# Patient Record
Sex: Female | Born: 1970 | Race: Black or African American | Hispanic: No | Marital: Single | Smoking: Former smoker
Health system: Southern US, Community
[De-identification: ages and names within clinical notes are randomized; demographics above are authoritative.]

## PROBLEM LIST (undated history)

## (undated) DIAGNOSIS — R946 Abnormal results of thyroid function studies: Secondary | ICD-10-CM

## (undated) DIAGNOSIS — K828 Other specified diseases of gallbladder: Secondary | ICD-10-CM

## (undated) DIAGNOSIS — I10 Essential (primary) hypertension: Secondary | ICD-10-CM

## (undated) DIAGNOSIS — R000 Tachycardia, unspecified: Secondary | ICD-10-CM

## (undated) DIAGNOSIS — I5021 Acute systolic (congestive) heart failure: Secondary | ICD-10-CM

## (undated) DIAGNOSIS — Z9119 Patient's noncompliance with other medical treatment and regimen: Secondary | ICD-10-CM

## (undated) DIAGNOSIS — Z91199 Patient's noncompliance with other medical treatment and regimen due to unspecified reason: Secondary | ICD-10-CM

## (undated) DIAGNOSIS — A4902 Methicillin resistant Staphylococcus aureus infection, unspecified site: Secondary | ICD-10-CM

## (undated) DIAGNOSIS — I5189 Other ill-defined heart diseases: Secondary | ICD-10-CM

## (undated) DIAGNOSIS — R112 Nausea with vomiting, unspecified: Secondary | ICD-10-CM

## (undated) HISTORY — PX: NO PAST SURGERIES: SHX2092

---

## 2000-10-22 ENCOUNTER — Encounter: Admission: RE | Admit: 2000-10-22 | Discharge: 2001-01-20 | Payer: Self-pay | Admitting: Obstetrics and Gynecology

## 2000-12-25 ENCOUNTER — Observation Stay (HOSPITAL_COMMUNITY): Admission: RE | Admit: 2000-12-25 | Discharge: 2000-12-26 | Payer: Self-pay | Admitting: General Surgery

## 2000-12-27 ENCOUNTER — Encounter (HOSPITAL_COMMUNITY): Admission: RE | Admit: 2000-12-27 | Discharge: 2001-01-26 | Payer: Self-pay | Admitting: General Surgery

## 2001-06-01 ENCOUNTER — Other Ambulatory Visit: Admission: RE | Admit: 2001-06-01 | Discharge: 2001-06-01 | Payer: Self-pay | Admitting: Obstetrics and Gynecology

## 2003-11-05 ENCOUNTER — Emergency Department (HOSPITAL_COMMUNITY): Admission: EM | Admit: 2003-11-05 | Discharge: 2003-11-05 | Payer: Self-pay | Admitting: Emergency Medicine

## 2009-11-09 ENCOUNTER — Inpatient Hospital Stay (HOSPITAL_COMMUNITY): Admission: EM | Admit: 2009-11-09 | Discharge: 2009-11-14 | Payer: Self-pay | Admitting: Emergency Medicine

## 2009-11-12 ENCOUNTER — Ambulatory Visit: Payer: Self-pay | Admitting: Internal Medicine

## 2009-11-13 ENCOUNTER — Encounter: Payer: Self-pay | Admitting: Internal Medicine

## 2010-03-17 DIAGNOSIS — K828 Other specified diseases of gallbladder: Secondary | ICD-10-CM

## 2010-03-17 HISTORY — DX: Other specified diseases of gallbladder: K82.8

## 2010-03-20 ENCOUNTER — Inpatient Hospital Stay (HOSPITAL_COMMUNITY): Admission: EM | Admit: 2010-03-20 | Discharge: 2010-03-24 | Payer: Self-pay | Source: Home / Self Care

## 2010-03-20 LAB — BLOOD GAS, ARTERIAL
Acid-Base Excess: 1.2 mmol/L (ref 0.0–2.0)
Bicarbonate: 25 mEq/L — ABNORMAL HIGH (ref 20.0–24.0)
FIO2: 21 %
O2 Saturation: 92.9 %
Patient temperature: 37
TCO2: 22.1 mmol/L (ref 0–100)
pCO2 arterial: 37.3 mmHg (ref 35.0–45.0)
pH, Arterial: 7.44 — ABNORMAL HIGH (ref 7.350–7.400)
pO2, Arterial: 67.3 mmHg — ABNORMAL LOW (ref 80.0–100.0)

## 2010-03-20 LAB — URINALYSIS, ROUTINE W REFLEX MICROSCOPIC
Ketones, ur: >80 mg/dL — AB
Leukocytes, UA: NEGATIVE
Nitrite: NEGATIVE
Protein, ur: >300 mg/dL — AB
Specific Gravity, Urine: >1.03 — ABNORMAL HIGH (ref 1.005–1.030)
Urine Glucose, Fasting: >1000 mg/dL — AB
Urobilinogen, UA: 0.2 mg/dL (ref 0.0–1.0)
pH: 5.5 (ref 5.0–8.0)

## 2010-03-20 LAB — DIFFERENTIAL
Basophils Absolute: 0 10*3/uL (ref 0.0–0.1)
Basophils Relative: 0 % (ref 0–1)
Eosinophils Absolute: 0 10*3/uL (ref 0.0–0.7)
Eosinophils Relative: 0 % (ref 0–5)
Lymphocytes Relative: 10 % — ABNORMAL LOW (ref 12–46)
Lymphs Abs: 0.8 10*3/uL (ref 0.7–4.0)
Monocytes Absolute: 0.6 10*3/uL (ref 0.1–1.0)
Monocytes Relative: 7 % (ref 3–12)
Neutro Abs: 6.7 10*3/uL (ref 1.7–7.7)
Neutrophils Relative %: 82 % — ABNORMAL HIGH (ref 43–77)

## 2010-03-20 LAB — HEPATIC FUNCTION PANEL
ALT: 23 U/L (ref 0–35)
AST: 17 U/L (ref 0–37)
Albumin: 4.4 g/dL (ref 3.5–5.2)
Alkaline Phosphatase: 76 U/L (ref 39–117)
Bilirubin, Direct: 0.2 mg/dL (ref 0.0–0.3)
Indirect Bilirubin: 0.9 mg/dL (ref 0.3–0.9)
Total Bilirubin: 1.1 mg/dL (ref 0.3–1.2)
Total Protein: 8 g/dL (ref 6.0–8.3)

## 2010-03-20 LAB — BASIC METABOLIC PANEL
BUN: 16 mg/dL (ref 6–23)
BUN: 15 mg/dL (ref 6–23)
CO2: 24 mEq/L (ref 19–32)
CO2: 24 mEq/L (ref 19–32)
Calcium: 10.1 mg/dL (ref 8.4–10.5)
Calcium: 8.6 mg/dL (ref 8.4–10.5)
Chloride: 97 mEq/L (ref 96–112)
Chloride: 104 mEq/L (ref 96–112)
Creatinine, Ser: 0.92 mg/dL (ref 0.4–1.2)
Creatinine, Ser: 0.72 mg/dL (ref 0.4–1.2)
GFR calc Af Amer: >60 mL/min (ref >60)
GFR calc Af Amer: >60 mL/min (ref >60)
GFR calc non Af Amer: >60 mL/min (ref >60)
GFR calc non Af Amer: >60 mL/min (ref >60)
Glucose, Bld: 283 mg/dL — ABNORMAL HIGH (ref 70–99)
Glucose, Bld: 216 mg/dL — ABNORMAL HIGH (ref 70–99)
Potassium: 3.2 mEq/L — ABNORMAL LOW (ref 3.5–5.1)
Potassium: 2.9 mEq/L — ABNORMAL LOW (ref 3.5–5.1)
Sodium: 140 mEq/L (ref 135–145)
Sodium: 140 mEq/L (ref 135–145)

## 2010-03-20 LAB — CBC
HCT: 41.7 % (ref 36.0–46.0)
Hemoglobin: 15 g/dL (ref 12.0–15.0)
MCH: 30.4 pg (ref 26.0–34.0)
MCHC: 36 g/dL (ref 30.0–36.0)
MCV: 84.4 fL (ref 78.0–100.0)
Platelets: 398 10*3/uL (ref 150–400)
RBC: 4.94 MIL/uL (ref 3.87–5.11)
RDW: 12.5 % (ref 11.5–15.5)
WBC: 8.2 10*3/uL (ref 4.0–10.5)

## 2010-03-20 LAB — GLUCOSE, CAPILLARY
Glucose-Capillary: 284 mg/dL — ABNORMAL HIGH (ref 70–99)
Glucose-Capillary: 181 mg/dL — ABNORMAL HIGH (ref 70–99)

## 2010-03-20 LAB — URINE MICROSCOPIC-ADD ON

## 2010-03-20 LAB — LIPASE, BLOOD: Lipase: 24 U/L (ref 11–59)

## 2010-03-20 LAB — MAGNESIUM: Magnesium: 1.8 mg/dL (ref 1.5–2.5)

## 2010-03-20 LAB — POCT PREGNANCY, URINE: Preg Test, Ur: NEGATIVE

## 2010-03-21 LAB — MAGNESIUM
Magnesium: 1.7 mg/dL (ref 1.5–2.5)
Magnesium: 2 mg/dL (ref 1.5–2.5)

## 2010-03-21 LAB — URINE CULTURE
Colony Count: 6000
Culture  Setup Time: 201201050113

## 2010-03-21 LAB — COMPREHENSIVE METABOLIC PANEL
ALT: 18 U/L (ref 0–35)
AST: 13 U/L (ref 0–37)
Albumin: 3.2 g/dL — ABNORMAL LOW (ref 3.5–5.2)
Alkaline Phosphatase: 46 U/L (ref 39–117)
BUN: 12 mg/dL (ref 6–23)
CO2: 25 mEq/L (ref 19–32)
Calcium: 8.2 mg/dL — ABNORMAL LOW (ref 8.4–10.5)
Chloride: 112 mEq/L (ref 96–112)
Creatinine, Ser: 0.67 mg/dL (ref 0.4–1.2)
GFR calc Af Amer: >60 mL/min (ref >60)
GFR calc non Af Amer: >60 mL/min (ref >60)
Glucose, Bld: 90 mg/dL (ref 70–99)
Potassium: 3.2 mEq/L — ABNORMAL LOW (ref 3.5–5.1)
Sodium: 145 mEq/L (ref 135–145)
Total Bilirubin: 1.1 mg/dL (ref 0.3–1.2)
Total Protein: 6.3 g/dL (ref 6.0–8.3)

## 2010-03-21 LAB — CBC
HCT: 33.4 % — ABNORMAL LOW (ref 36.0–46.0)
Hemoglobin: 11.5 g/dL — ABNORMAL LOW (ref 12.0–15.0)
MCH: 29.6 pg (ref 26.0–34.0)
MCHC: 34.4 g/dL (ref 30.0–36.0)
MCV: 86.1 fL (ref 78.0–100.0)
Platelets: 321 10*3/uL (ref 150–400)
RBC: 3.88 MIL/uL (ref 3.87–5.11)
RDW: 12.8 % (ref 11.5–15.5)
WBC: 7.4 10*3/uL (ref 4.0–10.5)

## 2010-03-21 LAB — GLUCOSE, CAPILLARY
Glucose-Capillary: 119 mg/dL — ABNORMAL HIGH (ref 70–99)
Glucose-Capillary: 172 mg/dL — ABNORMAL HIGH (ref 70–99)
Glucose-Capillary: 111 mg/dL — ABNORMAL HIGH (ref 70–99)
Glucose-Capillary: 179 mg/dL — ABNORMAL HIGH (ref 70–99)

## 2010-03-21 LAB — DIFFERENTIAL
Basophils Absolute: 0 10*3/uL (ref 0.0–0.1)
Basophils Relative: 0 % (ref 0–1)
Eosinophils Absolute: 0 10*3/uL (ref 0.0–0.7)
Eosinophils Relative: 1 % (ref 0–5)
Lymphocytes Relative: 30 % (ref 12–46)
Lymphs Abs: 2.2 10*3/uL (ref 0.7–4.0)
Monocytes Absolute: 1 10*3/uL (ref 0.1–1.0)
Monocytes Relative: 13 % — ABNORMAL HIGH (ref 3–12)
Neutro Abs: 4.1 10*3/uL (ref 1.7–7.7)
Neutrophils Relative %: 56 % (ref 43–77)

## 2010-03-21 LAB — HEMOGLOBIN A1C
Hgb A1c MFr Bld: 8.1 % — ABNORMAL HIGH (ref <5.7)
Mean Plasma Glucose: 186 mg/dL — ABNORMAL HIGH (ref <117)

## 2010-03-22 LAB — CBC
HCT: 33.6 % — ABNORMAL LOW (ref 36.0–46.0)
Hemoglobin: 11.7 g/dL — ABNORMAL LOW (ref 12.0–15.0)
MCH: 29.9 pg (ref 26.0–34.0)
MCHC: 34.8 g/dL (ref 30.0–36.0)
MCV: 85.9 fL (ref 78.0–100.0)
Platelets: 315 10*3/uL (ref 150–400)
RBC: 3.91 MIL/uL (ref 3.87–5.11)
RDW: 12.2 % (ref 11.5–15.5)
WBC: 5.8 10*3/uL (ref 4.0–10.5)

## 2010-03-22 LAB — GLUCOSE, CAPILLARY
Glucose-Capillary: 161 mg/dL — ABNORMAL HIGH (ref 70–99)
Glucose-Capillary: 238 mg/dL — ABNORMAL HIGH (ref 70–99)

## 2010-03-22 LAB — DIFFERENTIAL
Basophils Absolute: 0 10*3/uL (ref 0.0–0.1)
Basophils Relative: 0 % (ref 0–1)
Eosinophils Absolute: 0.1 10*3/uL (ref 0.0–0.7)
Eosinophils Relative: 2 % (ref 0–5)
Lymphocytes Relative: 47 % — ABNORMAL HIGH (ref 12–46)
Lymphs Abs: 2.7 10*3/uL (ref 0.7–4.0)
Monocytes Absolute: 0.8 10*3/uL (ref 0.1–1.0)
Monocytes Relative: 13 % — ABNORMAL HIGH (ref 3–12)
Neutro Abs: 2.2 10*3/uL (ref 1.7–7.7)
Neutrophils Relative %: 38 % — ABNORMAL LOW (ref 43–77)

## 2010-03-22 LAB — COMPREHENSIVE METABOLIC PANEL
ALT: 18 U/L (ref 0–35)
AST: 14 U/L (ref 0–37)
Albumin: 3 g/dL — ABNORMAL LOW (ref 3.5–5.2)
Alkaline Phosphatase: 45 U/L (ref 39–117)
BUN: 5 mg/dL — ABNORMAL LOW (ref 6–23)
CO2: 26 mEq/L (ref 19–32)
Calcium: 8.3 mg/dL — ABNORMAL LOW (ref 8.4–10.5)
Chloride: 110 mEq/L (ref 96–112)
Creatinine, Ser: 0.51 mg/dL (ref 0.4–1.2)
GFR calc Af Amer: >60 mL/min (ref >60)
GFR calc non Af Amer: >60 mL/min (ref >60)
Glucose, Bld: 101 mg/dL — ABNORMAL HIGH (ref 70–99)
Potassium: 3.4 mEq/L — ABNORMAL LOW (ref 3.5–5.1)
Sodium: 142 mEq/L (ref 135–145)
Total Bilirubin: 1 mg/dL (ref 0.3–1.2)
Total Protein: 5.9 g/dL — ABNORMAL LOW (ref 6.0–8.3)

## 2010-03-22 LAB — LIPASE, BLOOD: Lipase: 41 U/L (ref 11–59)

## 2010-03-25 ENCOUNTER — Encounter (HOSPITAL_COMMUNITY): Admission: RE | Admit: 2010-03-25 | Payer: Self-pay | Source: Home / Self Care | Admitting: Internal Medicine

## 2010-04-01 LAB — BASIC METABOLIC PANEL
BUN: 5 mg/dL — ABNORMAL LOW (ref 6–23)
CO2: 27 mEq/L (ref 19–32)
Calcium: 8.7 mg/dL (ref 8.4–10.5)
Chloride: 103 mEq/L (ref 96–112)
Creatinine, Ser: 0.65 mg/dL (ref 0.4–1.2)
GFR calc Af Amer: >60 mL/min (ref >60)
GFR calc non Af Amer: >60 mL/min (ref >60)
Glucose, Bld: 98 mg/dL (ref 70–99)
Potassium: 3.7 mEq/L (ref 3.5–5.1)
Sodium: 137 mEq/L (ref 135–145)

## 2010-04-01 LAB — HEMOGLOBIN A1C
Hgb A1c MFr Bld: 8.3 % — ABNORMAL HIGH (ref <5.7)
Mean Plasma Glucose: 192 mg/dL — ABNORMAL HIGH (ref <117)

## 2010-04-01 LAB — CULTURE, BLOOD (ROUTINE X 2)
Culture: NO GROWTH
Culture: NO GROWTH

## 2010-04-01 LAB — GLUCOSE, CAPILLARY
Glucose-Capillary: 168 mg/dL — ABNORMAL HIGH (ref 70–99)
Glucose-Capillary: 116 mg/dL — ABNORMAL HIGH (ref 70–99)
Glucose-Capillary: 135 mg/dL — ABNORMAL HIGH (ref 70–99)
Glucose-Capillary: 177 mg/dL — ABNORMAL HIGH (ref 70–99)
Glucose-Capillary: 185 mg/dL — ABNORMAL HIGH (ref 70–99)
Glucose-Capillary: 162 mg/dL — ABNORMAL HIGH (ref 70–99)
Glucose-Capillary: 115 mg/dL — ABNORMAL HIGH (ref 70–99)
Glucose-Capillary: 244 mg/dL — ABNORMAL HIGH (ref 70–99)

## 2010-04-01 LAB — DIFFERENTIAL
Basophils Absolute: 0 10*3/uL (ref 0.0–0.1)
Basophils Relative: 0 % (ref 0–1)
Eosinophils Absolute: 0.1 10*3/uL (ref 0.0–0.7)
Eosinophils Relative: 1 % (ref 0–5)
Lymphocytes Relative: 43 % (ref 12–46)
Lymphs Abs: 2.3 10*3/uL (ref 0.7–4.0)
Monocytes Absolute: 0.7 10*3/uL (ref 0.1–1.0)
Monocytes Relative: 13 % — ABNORMAL HIGH (ref 3–12)
Neutro Abs: 2.2 10*3/uL (ref 1.7–7.7)
Neutrophils Relative %: 42 % — ABNORMAL LOW (ref 43–77)

## 2010-04-01 LAB — CBC
HCT: 36.7 % (ref 36.0–46.0)
Hemoglobin: 12.9 g/dL (ref 12.0–15.0)
MCH: 29.5 pg (ref 26.0–34.0)
MCHC: 35.1 g/dL (ref 30.0–36.0)
MCV: 84 fL (ref 78.0–100.0)
Platelets: 331 10*3/uL (ref 150–400)
RBC: 4.37 MIL/uL (ref 3.87–5.11)
RDW: 11.9 % (ref 11.5–15.5)
WBC: 5.3 10*3/uL (ref 4.0–10.5)

## 2010-04-01 LAB — MAGNESIUM: Magnesium: 1.8 mg/dL (ref 1.5–2.5)

## 2010-04-09 ENCOUNTER — Encounter (HOSPITAL_COMMUNITY)
Admission: RE | Admit: 2010-04-09 | Discharge: 2010-04-16 | Payer: Self-pay | Source: Home / Self Care | Attending: Family Medicine | Admitting: Family Medicine

## 2010-04-16 NOTE — Letter (Signed)
Summary: CONSULTATION  CONSULTATION   Imported By: Rexene Alberts 11/13/2009 11:46:23  _____________________________________________________________________  External Attachment:    Type:   Image     Comment:   External Document

## 2010-05-04 ENCOUNTER — Emergency Department (HOSPITAL_COMMUNITY)
Admission: EM | Admit: 2010-05-04 | Discharge: 2010-05-04 | Disposition: A | Discharge status: Home / Self Care | Payer: BC Managed Care – PPO | Attending: Emergency Medicine | Admitting: Emergency Medicine

## 2010-05-04 DIAGNOSIS — I10 Essential (primary) hypertension: Secondary | ICD-10-CM | POA: Insufficient documentation

## 2010-05-04 DIAGNOSIS — R112 Nausea with vomiting, unspecified: Secondary | ICD-10-CM | POA: Insufficient documentation

## 2010-05-04 DIAGNOSIS — E119 Type 2 diabetes mellitus without complications: Secondary | ICD-10-CM | POA: Insufficient documentation

## 2010-05-04 DIAGNOSIS — R197 Diarrhea, unspecified: Secondary | ICD-10-CM | POA: Insufficient documentation

## 2010-05-04 DIAGNOSIS — K219 Gastro-esophageal reflux disease without esophagitis: Secondary | ICD-10-CM | POA: Insufficient documentation

## 2010-05-04 DIAGNOSIS — Z79899 Other long term (current) drug therapy: Secondary | ICD-10-CM | POA: Insufficient documentation

## 2010-05-30 LAB — GLUCOSE, CAPILLARY
Glucose-Capillary: 115 mg/dL — ABNORMAL HIGH (ref 70–99)
Glucose-Capillary: 117 mg/dL — ABNORMAL HIGH (ref 70–99)
Glucose-Capillary: 118 mg/dL — ABNORMAL HIGH (ref 70–99)
Glucose-Capillary: 134 mg/dL — ABNORMAL HIGH (ref 70–99)
Glucose-Capillary: 135 mg/dL — ABNORMAL HIGH (ref 70–99)
Glucose-Capillary: 138 mg/dL — ABNORMAL HIGH (ref 70–99)
Glucose-Capillary: 142 mg/dL — ABNORMAL HIGH (ref 70–99)
Glucose-Capillary: 145 mg/dL — ABNORMAL HIGH (ref 70–99)
Glucose-Capillary: 151 mg/dL — ABNORMAL HIGH (ref 70–99)
Glucose-Capillary: 160 mg/dL — ABNORMAL HIGH (ref 70–99)
Glucose-Capillary: 163 mg/dL — ABNORMAL HIGH (ref 70–99)
Glucose-Capillary: 180 mg/dL — ABNORMAL HIGH (ref 70–99)
Glucose-Capillary: 184 mg/dL — ABNORMAL HIGH (ref 70–99)
Glucose-Capillary: 187 mg/dL — ABNORMAL HIGH (ref 70–99)
Glucose-Capillary: 187 mg/dL — ABNORMAL HIGH (ref 70–99)
Glucose-Capillary: 192 mg/dL — ABNORMAL HIGH (ref 70–99)
Glucose-Capillary: 192 mg/dL — ABNORMAL HIGH (ref 70–99)
Glucose-Capillary: 192 mg/dL — ABNORMAL HIGH (ref 70–99)
Glucose-Capillary: 201 mg/dL — ABNORMAL HIGH (ref 70–99)
Glucose-Capillary: 205 mg/dL — ABNORMAL HIGH (ref 70–99)
Glucose-Capillary: 223 mg/dL — ABNORMAL HIGH (ref 70–99)
Glucose-Capillary: 235 mg/dL — ABNORMAL HIGH (ref 70–99)
Glucose-Capillary: 251 mg/dL — ABNORMAL HIGH (ref 70–99)
Glucose-Capillary: 62 mg/dL — ABNORMAL LOW (ref 70–99)
Glucose-Capillary: 85 mg/dL (ref 70–99)

## 2010-05-30 LAB — URINE MICROSCOPIC-ADD ON

## 2010-05-30 LAB — T4, FREE: Free T4: 1.34 ng/dL (ref 0.80–1.80)

## 2010-05-30 LAB — BASIC METABOLIC PANEL
BUN: 10 mg/dL (ref 6–23)
BUN: 14 mg/dL (ref 6–23)
BUN: 4 mg/dL — ABNORMAL LOW (ref 6–23)
BUN: 5 mg/dL — ABNORMAL LOW (ref 6–23)
BUN: 6 mg/dL (ref 6–23)
BUN: 7 mg/dL (ref 6–23)
CO2: 13 mEq/L — ABNORMAL LOW (ref 19–32)
CO2: 14 mEq/L — ABNORMAL LOW (ref 19–32)
CO2: 21 mEq/L (ref 19–32)
CO2: 23 mEq/L (ref 19–32)
Calcium: 8.5 mg/dL (ref 8.4–10.5)
Calcium: 8.8 mg/dL (ref 8.4–10.5)
Calcium: 8.8 mg/dL (ref 8.4–10.5)
Chloride: 102 mEq/L (ref 96–112)
Chloride: 105 mEq/L (ref 96–112)
Chloride: 105 mEq/L (ref 96–112)
Chloride: 107 mEq/L (ref 96–112)
Chloride: 109 mEq/L (ref 96–112)
Chloride: 113 mEq/L — ABNORMAL HIGH (ref 96–112)
Chloride: 114 mEq/L — ABNORMAL HIGH (ref 96–112)
Creatinine, Ser: 0.54 mg/dL (ref 0.4–1.2)
Creatinine, Ser: 0.54 mg/dL (ref 0.4–1.2)
Creatinine, Ser: 0.58 mg/dL (ref 0.4–1.2)
Creatinine, Ser: 0.61 mg/dL (ref 0.4–1.2)
GFR calc Af Amer: >60 mL/min (ref >60)
GFR calc Af Amer: >60 mL/min (ref >60)
GFR calc Af Amer: >60 mL/min (ref >60)
GFR calc non Af Amer: >60 mL/min (ref >60)
GFR calc non Af Amer: >60 mL/min (ref >60)
GFR calc non Af Amer: >60 mL/min (ref >60)
GFR calc non Af Amer: >60 mL/min (ref >60)
GFR calc non Af Amer: >60 mL/min (ref >60)
Glucose, Bld: 102 mg/dL — ABNORMAL HIGH (ref 70–99)
Glucose, Bld: 158 mg/dL — ABNORMAL HIGH (ref 70–99)
Glucose, Bld: 160 mg/dL — ABNORMAL HIGH (ref 70–99)
Glucose, Bld: 189 mg/dL — ABNORMAL HIGH (ref 70–99)
Glucose, Bld: 261 mg/dL — ABNORMAL HIGH (ref 70–99)
Potassium: 3.2 mEq/L — ABNORMAL LOW (ref 3.5–5.1)
Potassium: 3.2 mEq/L — ABNORMAL LOW (ref 3.5–5.1)
Potassium: 3.3 mEq/L — ABNORMAL LOW (ref 3.5–5.1)
Potassium: 3.4 mEq/L — ABNORMAL LOW (ref 3.5–5.1)
Potassium: 3.5 mEq/L (ref 3.5–5.1)
Potassium: 3.6 mEq/L (ref 3.5–5.1)
Potassium: 3.8 mEq/L (ref 3.5–5.1)
Potassium: 3.9 mEq/L (ref 3.5–5.1)
Sodium: 134 mEq/L — ABNORMAL LOW (ref 135–145)
Sodium: 138 mEq/L (ref 135–145)
Sodium: 139 mEq/L (ref 135–145)

## 2010-05-30 LAB — LIPASE, BLOOD
Lipase: 42 U/L (ref 11–59)
Lipase: 77 U/L — ABNORMAL HIGH (ref 11–59)

## 2010-05-30 LAB — DIFFERENTIAL
Basophils Absolute: 0 10*3/uL (ref 0.0–0.1)
Basophils Relative: 0 % (ref 0–1)
Basophils Relative: 1 % (ref 0–1)
Eosinophils Absolute: 0 10*3/uL (ref 0.0–0.7)
Eosinophils Absolute: 0 10*3/uL (ref 0.0–0.7)
Eosinophils Relative: 0 % (ref 0–5)
Eosinophils Relative: 0 % (ref 0–5)
Lymphocytes Relative: 16 % (ref 12–46)
Lymphocytes Relative: 8 % — ABNORMAL LOW (ref 12–46)
Lymphs Abs: 0.6 10*3/uL — ABNORMAL LOW (ref 0.7–4.0)
Lymphs Abs: 1.1 10*3/uL (ref 0.7–4.0)
Lymphs Abs: 2.3 10*3/uL (ref 0.7–4.0)
Monocytes Absolute: 0.8 10*3/uL (ref 0.1–1.0)
Monocytes Relative: 14 % — ABNORMAL HIGH (ref 3–12)
Neutro Abs: 11.1 10*3/uL — ABNORMAL HIGH (ref 1.7–7.7)
Neutro Abs: 13.3 10*3/uL — ABNORMAL HIGH (ref 1.7–7.7)
Neutro Abs: 3.4 10*3/uL (ref 1.7–7.7)
Neutrophils Relative %: 51 % (ref 43–77)
Neutrophils Relative %: 76 % (ref 43–77)
Neutrophils Relative %: 93 % — ABNORMAL HIGH (ref 43–77)

## 2010-05-30 LAB — CBC
HCT: 40.8 % (ref 36.0–46.0)
HCT: 46.6 % — ABNORMAL HIGH (ref 36.0–46.0)
Hemoglobin: 13.5 g/dL (ref 12.0–15.0)
Hemoglobin: 15.6 g/dL — ABNORMAL HIGH (ref 12.0–15.0)
MCH: 30.3 pg (ref 26.0–34.0)
MCH: 30.5 pg (ref 26.0–34.0)
MCHC: 33.4 g/dL (ref 30.0–36.0)
MCHC: 33.6 g/dL (ref 30.0–36.0)
Platelets: 297 10*3/uL (ref 150–400)
Platelets: 327 10*3/uL (ref 150–400)
RBC: 5 MIL/uL (ref 3.87–5.11)
RBC: 5.14 MIL/uL — ABNORMAL HIGH (ref 3.87–5.11)
WBC: 13.1 10*3/uL — ABNORMAL HIGH (ref 4.0–10.5)
WBC: 14.2 10*3/uL — ABNORMAL HIGH (ref 4.0–10.5)
WBC: 6.7 10*3/uL (ref 4.0–10.5)

## 2010-05-30 LAB — COMPREHENSIVE METABOLIC PANEL
ALT: 17 U/L (ref 0–35)
ALT: 21 U/L (ref 0–35)
AST: 13 U/L (ref 0–37)
Alkaline Phosphatase: 97 U/L (ref 39–117)
BUN: 16 mg/dL (ref 6–23)
CO2: 11 mEq/L — ABNORMAL LOW (ref 19–32)
CO2: 19 mEq/L (ref 19–32)
Calcium: 9.1 mg/dL (ref 8.4–10.5)
Calcium: 9.7 mg/dL (ref 8.4–10.5)
Chloride: 102 mEq/L (ref 96–112)
Chloride: 106 mEq/L (ref 96–112)
Creatinine, Ser: 0.65 mg/dL (ref 0.4–1.2)
GFR calc Af Amer: >60 mL/min (ref >60)
GFR calc Af Amer: >60 mL/min (ref >60)
GFR calc non Af Amer: 53 mL/min — ABNORMAL LOW (ref >60)
GFR calc non Af Amer: >60 mL/min (ref >60)
Glucose, Bld: 244 mg/dL — ABNORMAL HIGH (ref 70–99)
Glucose, Bld: 413 mg/dL — ABNORMAL HIGH (ref 70–99)
Potassium: 4.3 mEq/L (ref 3.5–5.1)
Total Bilirubin: 1 mg/dL (ref 0.3–1.2)
Total Protein: 8.5 g/dL — ABNORMAL HIGH (ref 6.0–8.3)

## 2010-05-30 LAB — RAPID URINE DRUG SCREEN, HOSP PERFORMED
Amphetamines: NOT DETECTED
Benzodiazepines: NOT DETECTED
Cocaine: NOT DETECTED
Tetrahydrocannabinol: NOT DETECTED

## 2010-05-30 LAB — LIPID PANEL: VLDL: 36 mg/dL (ref 0–40)

## 2010-05-30 LAB — HEPATIC FUNCTION PANEL
ALT: 12 U/L (ref 0–35)
AST: 12 U/L (ref 0–37)
Albumin: 2.8 g/dL — ABNORMAL LOW (ref 3.5–5.2)
Albumin: 2.8 g/dL — ABNORMAL LOW (ref 3.5–5.2)
Alkaline Phosphatase: 53 U/L (ref 39–117)
Alkaline Phosphatase: 63 U/L (ref 39–117)
Bilirubin, Direct: 0.2 mg/dL (ref 0.0–0.3)
Indirect Bilirubin: 1.3 mg/dL — ABNORMAL HIGH (ref 0.3–0.9)
Indirect Bilirubin: 1.4 mg/dL — ABNORMAL HIGH (ref 0.3–0.9)
Total Bilirubin: 1.6 mg/dL — ABNORMAL HIGH (ref 0.3–1.2)
Total Protein: 5.9 g/dL — ABNORMAL LOW (ref 6.0–8.3)
Total Protein: 6.2 g/dL (ref 6.0–8.3)

## 2010-05-30 LAB — URINALYSIS, ROUTINE W REFLEX MICROSCOPIC
Glucose, UA: >1000 mg/dL — AB
Leukocytes, UA: NEGATIVE
pH: 5.5 (ref 5.0–8.0)

## 2010-05-30 LAB — WET PREP, GENITAL

## 2010-05-30 LAB — POCT PREGNANCY, URINE: Preg Test, Ur: NEGATIVE

## 2010-05-30 LAB — HEMOGLOBIN A1C: Mean Plasma Glucose: 309 mg/dL — ABNORMAL HIGH (ref <117)

## 2010-05-30 LAB — URINE CULTURE

## 2010-05-30 LAB — MAGNESIUM
Magnesium: 1.7 mg/dL (ref 1.5–2.5)
Magnesium: 1.7 mg/dL (ref 1.5–2.5)

## 2010-05-30 LAB — TSH: TSH: 0.486 u[IU]/mL (ref 0.350–4.500)

## 2010-05-30 LAB — GC/CHLAMYDIA PROBE AMP, GENITAL: GC Probe Amp, Genital: NEGATIVE

## 2010-05-30 LAB — BRAIN NATRIURETIC PEPTIDE: Pro B Natriuretic peptide (BNP): 199 pg/mL — ABNORMAL HIGH (ref 0.0–100.0)

## 2010-08-02 NOTE — Consult Note (Signed)
[facility]  Patient:    DAY, GREB Visit Number: [id number] MRN: [id number]          Service Type: REC Location: [facility] Attending Physician:  Barbaraann Barthel Dictated by:   Barbaraann Barthel, M.D. Proc. Date: 12/24/00 Admit Date:  12/27/2000   CC:         Mel Almond, M.D.   Consultation Report  Dear Brett Canales:  I saw Ms. Lauren Steele in my office on December 24, 2000.  As you know, she was seen by your physicians assistant today and she had an appointment with me tomorrow.  I told them to send her directly over to my office when they described that she might have a breast abscess.  In essence, she has an acutely inflamed right breast with, I suspect, an abscess.  She has had problems with this for three days and this is somewhat complicated in the fact that she applied a heating pad and sustained a superficial burn as well to her breast.  At any rate, she had an area of some spontaneous drainage and some necrotic skin around the lateral aspect of the nipple areola complex.  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  We will plan for a biopsy and incision and drainage and packing of her breast tomorrow.  We have placed her on antibiotics and we will check her blood sugar preoperatively as well, for as you know, she is a type 2 diabetic.  We have her scheduled for surgery tomorrow and I will make sure you get a copy of the surgical note and pathology and I thank you for sending her my way. Dictated by:   Barbaraann Barthel, M.D. Attending Physician:  Barbaraann Barthel DD:  12/24/00 TD:  12/25/00 Job: [id number]

## 2010-08-02 NOTE — Op Note (Signed)
[facility]  Patient:    Lauren Steele, Lauren Steele Visit Number: [id number] MRN: [id number]          Service Type: REC Location: [facility] Attending Physician:  Barbaraann Barthel Dictated by:   Barbaraann Barthel, M.D. Proc. Date: 12/25/00 Admit Date:  12/27/2000                             Operative Report  PREOPERATIVE DIAGNOSIS:  Right breast abscess.  POSTOPERATIVE DIAGNOSIS:  Right breast abscess.  OPERATION:  Right partial mastectomy (incision and drainage of large abscess with extensive debridement).  SURGEON:  Barbaraann Barthel, M.D.  CLINICAL NOTE:  This is a 40 year old black, type 2 diabetic female patient who was seen by the medical service for a painful right breast.  She was referred to surgery when she was noted to have a breast abscess.  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  She went to see Dr. Lilyan Punt, and Dr. Cathlyn Parsons physicians assistant sent her my way.  She was admitted directly the next day for incision and drainage after beginning Cipro and warm compresses.  It should be stated the skin changes were also complicated as the patient, during her three days of her problem, had self treated herself with a heating pad and received a partial-thickness burn over the area of her right breast.  GROSS OPERATIVE FINDINGS:  The patient had a deformed nipple-areolar complex with an obvious swelling and peeling and peau dorange of her right breast. This was located approximately at the 3 oclock position and extended medially on her breast.  We obtained a great amount of yellowish, thick pus which was cultured for aerobic and anaerobic growth, and extensive debridement of necrotic breast tissue.  TECHNIQUE:  The patient was placed in the supine position.  After  adequate administration of general anesthesia, her right hemithorax was prepped with Betadine solution and draped in the usual manner.  A periareolar incision was carried out over the medial aspect of the right nipple complex, and pus was evacuated, and cultures were obtained.  The necrotic skin was debrided along with necrotic breast tissue.  These were sent as a specimen.  We also had called the pathologist beforehand to examine the tissue to rule out inflammatory carcinoma of the breast.  The breast tissue was sent also in formalin for this biopsy.  The wound was irrigated, and the bleeding was controlled with the cautery device.  After obtaining good hemostasis and debriding all the necrotic tissue, the wound was packed open with 3 inch Kerlix cling that had been soaked in normal saline solution; 4 x 4s and ABD pads were placed as a dressing.  Prior to closure, all sponge, needle, and instrument counts were found to be correct.  Estimated blood loss was maybe 50 cc.  The patient tolerated well.  She received 1600 cc of crystalloid intraoperatively. Dictated by:   Barbaraann Barthel, M.D. Attending Physician:  Barbaraann Barthel DD:  12/25/00 TD:  12/27/00 Job: [id number]

## 2011-05-29 ENCOUNTER — Encounter (HOSPITAL_COMMUNITY): Payer: Self-pay | Admitting: Emergency Medicine

## 2011-05-29 ENCOUNTER — Inpatient Hospital Stay (HOSPITAL_COMMUNITY)
Admission: EM | Admit: 2011-05-29 | Discharge: 2011-06-04 | DRG: 294 | Disposition: A | Discharge status: Home / Self Care | Payer: BC Managed Care – PPO | Attending: Internal Medicine | Admitting: Internal Medicine

## 2011-05-29 ENCOUNTER — Other Ambulatory Visit: Payer: Self-pay

## 2011-05-29 DIAGNOSIS — Z9119 Patient's noncompliance with other medical treatment and regimen: Secondary | ICD-10-CM

## 2011-05-29 DIAGNOSIS — L03317 Cellulitis of buttock: Secondary | ICD-10-CM | POA: Diagnosis present

## 2011-05-29 DIAGNOSIS — E1110 Type 2 diabetes mellitus with ketoacidosis without coma: Secondary | ICD-10-CM

## 2011-05-29 DIAGNOSIS — E876 Hypokalemia: Secondary | ICD-10-CM | POA: Diagnosis present

## 2011-05-29 DIAGNOSIS — Z91199 Patient's noncompliance with other medical treatment and regimen due to unspecified reason: Secondary | ICD-10-CM

## 2011-05-29 DIAGNOSIS — I498 Other specified cardiac arrhythmias: Secondary | ICD-10-CM | POA: Diagnosis present

## 2011-05-29 DIAGNOSIS — K828 Other specified diseases of gallbladder: Secondary | ICD-10-CM | POA: Diagnosis present

## 2011-05-29 DIAGNOSIS — K3184 Gastroparesis: Secondary | ICD-10-CM | POA: Diagnosis present

## 2011-05-29 DIAGNOSIS — R000 Tachycardia, unspecified: Secondary | ICD-10-CM | POA: Diagnosis present

## 2011-05-29 DIAGNOSIS — E1149 Type 2 diabetes mellitus with other diabetic neurological complication: Secondary | ICD-10-CM | POA: Diagnosis present

## 2011-05-29 DIAGNOSIS — A4902 Methicillin resistant Staphylococcus aureus infection, unspecified site: Secondary | ICD-10-CM | POA: Diagnosis present

## 2011-05-29 DIAGNOSIS — E1310 Other specified diabetes mellitus with ketoacidosis without coma: Principal | ICD-10-CM | POA: Diagnosis present

## 2011-05-29 DIAGNOSIS — L0231 Cutaneous abscess of buttock: Secondary | ICD-10-CM | POA: Diagnosis present

## 2011-05-29 DIAGNOSIS — Z794 Long term (current) use of insulin: Secondary | ICD-10-CM

## 2011-05-29 DIAGNOSIS — I10 Essential (primary) hypertension: Secondary | ICD-10-CM | POA: Diagnosis present

## 2011-05-29 DIAGNOSIS — K920 Hematemesis: Secondary | ICD-10-CM | POA: Diagnosis present

## 2011-05-29 HISTORY — DX: Tachycardia, unspecified: R00.0

## 2011-05-29 HISTORY — DX: Methicillin resistant Staphylococcus aureus infection, unspecified site: A49.02

## 2011-05-29 HISTORY — DX: Essential (primary) hypertension: I10

## 2011-05-29 HISTORY — DX: Patient's noncompliance with other medical treatment and regimen due to unspecified reason: Z91.199

## 2011-05-29 HISTORY — DX: Patient's noncompliance with other medical treatment and regimen: Z91.19

## 2011-05-29 LAB — CBC
Hemoglobin: 12.7 g/dL (ref 12.0–15.0)
MCH: 30.2 pg (ref 26.0–34.0)
MCHC: 35.5 g/dL (ref 30.0–36.0)
Platelets: 541 10*3/uL — ABNORMAL HIGH (ref 150–400)
RDW: 13 % (ref 11.5–15.5)

## 2011-05-29 LAB — COMPREHENSIVE METABOLIC PANEL
AST: 8 U/L (ref 0–37)
Albumin: 2.7 g/dL — ABNORMAL LOW (ref 3.5–5.2)
Alkaline Phosphatase: 117 U/L (ref 39–117)
BUN: 7 mg/dL (ref 6–23)
Chloride: 95 mEq/L — ABNORMAL LOW (ref 96–112)
Potassium: 2.6 mEq/L — CL (ref 3.5–5.1)
Total Protein: 8.7 g/dL — ABNORMAL HIGH (ref 6.0–8.3)

## 2011-05-29 LAB — DIFFERENTIAL
Basophils Absolute: 0 10*3/uL (ref 0.0–0.1)
Basophils Relative: 0 % (ref 0–1)
Eosinophils Absolute: 0 10*3/uL (ref 0.0–0.7)
Monocytes Relative: 6 % (ref 3–12)
Neutro Abs: 11.3 10*3/uL — ABNORMAL HIGH (ref 1.7–7.7)
Neutrophils Relative %: 85 % — ABNORMAL HIGH (ref 43–77)

## 2011-05-29 LAB — BASIC METABOLIC PANEL
CO2: 17 mEq/L — ABNORMAL LOW (ref 19–32)
Calcium: 10 mg/dL (ref 8.4–10.5)
Calcium: 9.7 mg/dL (ref 8.4–10.5)
Creatinine, Ser: 0.75 mg/dL (ref 0.50–1.10)
GFR calc Af Amer: >90 mL/min (ref >90)
GFR calc Af Amer: >90 mL/min (ref >90)
GFR calc non Af Amer: >90 mL/min (ref >90)
GFR calc non Af Amer: >90 mL/min (ref >90)
Potassium: 2.6 mEq/L — CL (ref 3.5–5.1)
Sodium: 142 mEq/L (ref 135–145)
Sodium: 143 mEq/L (ref 135–145)

## 2011-05-29 LAB — GLUCOSE, CAPILLARY
Glucose-Capillary: 176 mg/dL — ABNORMAL HIGH (ref 70–99)
Glucose-Capillary: 193 mg/dL — ABNORMAL HIGH (ref 70–99)
Glucose-Capillary: 208 mg/dL — ABNORMAL HIGH (ref 70–99)
Glucose-Capillary: 248 mg/dL — ABNORMAL HIGH (ref 70–99)
Glucose-Capillary: 367 mg/dL — ABNORMAL HIGH (ref 70–99)

## 2011-05-29 LAB — BLOOD GAS, ARTERIAL
Acid-base deficit: 13.2 mmol/L — ABNORMAL HIGH (ref 0.0–2.0)
pCO2 arterial: 22.8 mmHg — ABNORMAL LOW (ref 35.0–45.0)
pH, Arterial: 7.328 — ABNORMAL LOW (ref 7.350–7.400)
pO2, Arterial: 97.5 mmHg (ref 80.0–100.0)

## 2011-05-29 LAB — MRSA PCR SCREENING: MRSA by PCR: INVALID — AB

## 2011-05-29 LAB — LIPASE, BLOOD: Lipase: 34 U/L (ref 11–59)

## 2011-05-29 LAB — KETONES, QUALITATIVE

## 2011-05-29 MED ORDER — POTASSIUM CHLORIDE 10 MEQ/100ML IV SOLN
10.0000 meq | INTRAVENOUS | Status: AC
  Administered 2011-05-29 (×4): 10 meq via INTRAVENOUS
  Filled 2011-05-29 (×2): qty 100
  Filled 2011-05-29: qty 200

## 2011-05-29 MED ORDER — ACETAMINOPHEN 325 MG PO TABS
650.0000 mg | ORAL_TABLET | Freq: Four times a day (QID) | ORAL | Status: DC | PRN
  Filled 2011-05-29: qty 2

## 2011-05-29 MED ORDER — SODIUM CHLORIDE 0.9 % IV SOLN
INTRAVENOUS | Status: DC
  Administered 2011-05-29: 17:00:00 via INTRAVENOUS

## 2011-05-29 MED ORDER — DEXTROSE 50 % IV SOLN: 25.0000 mL | INTRAVENOUS | Status: DC | PRN

## 2011-05-29 MED ORDER — ONDANSETRON HCL 4 MG/2ML IJ SOLN
INTRAMUSCULAR | Status: AC
  Administered 2011-05-29: 4 mg
  Filled 2011-05-29: qty 2

## 2011-05-29 MED ORDER — METOPROLOL TARTRATE 1 MG/ML IV SOLN
5.0000 mg | Freq: Four times a day (QID) | INTRAVENOUS | Status: DC
  Administered 2011-05-29 – 2011-05-31 (×6): 5 mg via INTRAVENOUS
  Filled 2011-05-29 (×6): qty 5

## 2011-05-29 MED ORDER — INFLUENZA VIRUS VACC SPLIT PF IM SUSP
0.5000 mL | INTRAMUSCULAR | Status: AC
  Administered 2011-05-30: 0.5 mL via INTRAMUSCULAR
  Filled 2011-05-29: qty 0.5

## 2011-05-29 MED ORDER — POTASSIUM CHLORIDE CRYS ER 20 MEQ PO TBCR
40.0000 meq | EXTENDED_RELEASE_TABLET | ORAL | Status: AC
  Administered 2011-05-29 – 2011-05-30 (×2): 40 meq via ORAL
  Filled 2011-05-29 (×2): qty 2

## 2011-05-29 MED ORDER — POTASSIUM CHLORIDE IN NACL 40-0.9 MEQ/L-% IV SOLN
INTRAVENOUS | Status: DC
  Administered 2011-05-29: 20:00:00 via INTRAVENOUS
  Administered 2011-05-30: 125 mL/h via INTRAVENOUS
  Administered 2011-05-30 – 2011-05-31 (×2): via INTRAVENOUS
  Administered 2011-05-31: 125 mL/h via INTRAVENOUS
  Administered 2011-05-31: 22:00:00 via INTRAVENOUS
  Administered 2011-06-01: 20 mL/h via INTRAVENOUS
  Administered 2011-06-02: 22:00:00 via INTRAVENOUS
  Filled 2011-05-29 (×16): qty 1000

## 2011-05-29 MED ORDER — SODIUM CHLORIDE 0.9 % IV SOLN
Freq: Once | INTRAVENOUS | Status: AC
  Administered 2011-05-29: 15:00:00 via INTRAVENOUS

## 2011-05-29 MED ORDER — POTASSIUM CHLORIDE IN NACL 40-0.9 MEQ/L-% IV SOLN
INTRAVENOUS | Status: AC
  Filled 2011-05-29: qty 2000

## 2011-05-29 MED ORDER — SODIUM CHLORIDE 0.9 % IV SOLN
INTRAVENOUS | Status: AC
  Administered 2011-05-29: 17:00:00 via INTRAVENOUS
  Filled 2011-05-29: qty 1

## 2011-05-29 MED ORDER — METOCLOPRAMIDE HCL 5 MG/ML IJ SOLN
10.0000 mg | Freq: Four times a day (QID) | INTRAMUSCULAR | Status: DC
  Administered 2011-05-29 – 2011-06-01 (×11): 10 mg via INTRAVENOUS
  Filled 2011-05-29 (×11): qty 2

## 2011-05-29 MED ORDER — KCL IN DEXTROSE-NACL 20-5-0.45 MEQ/L-%-% IV SOLN
INTRAVENOUS | Status: DC
  Administered 2011-05-29 – 2011-05-30 (×2): via INTRAVENOUS

## 2011-05-29 MED ORDER — PROMETHAZINE HCL 25 MG/ML IJ SOLN
12.5000 mg | INTRAMUSCULAR | Status: DC | PRN
  Administered 2011-05-29 – 2011-06-03 (×21): 12.5 mg via INTRAVENOUS
  Filled 2011-05-29 (×23): qty 1

## 2011-05-29 MED ORDER — INSULIN ASPART 100 UNIT/ML IV SOLN
5.0000 [IU] | Freq: Once | INTRAVENOUS | Status: AC
  Administered 2011-05-29: 5 [IU] via INTRAVENOUS
  Filled 2011-05-29: qty 0.05

## 2011-05-29 MED ORDER — PNEUMOCOCCAL VAC POLYVALENT 25 MCG/0.5ML IJ INJ
0.5000 mL | INJECTION | INTRAMUSCULAR | Status: AC
  Administered 2011-05-30: 0.5 mL via INTRAMUSCULAR
  Filled 2011-05-29: qty 0.5

## 2011-05-29 MED ORDER — ACETAMINOPHEN 650 MG RE SUPP: 650.0000 mg | Freq: Four times a day (QID) | RECTAL | Status: DC | PRN

## 2011-05-29 MED ORDER — PANTOPRAZOLE SODIUM 40 MG IV SOLR
40.0000 mg | Freq: Two times a day (BID) | INTRAVENOUS | Status: DC
  Administered 2011-05-29 – 2011-05-30 (×3): 40 mg via INTRAVENOUS
  Filled 2011-05-29 (×3): qty 40

## 2011-05-29 MED ORDER — PROMETHAZINE HCL 25 MG/ML IJ SOLN
12.5000 mg | Freq: Once | INTRAMUSCULAR | Status: AC
  Administered 2011-05-29: 12.5 mg via INTRAVENOUS
  Filled 2011-05-29: qty 1

## 2011-05-29 MED ORDER — DEXTROSE-NACL 5-0.45 % IV SOLN
INTRAVENOUS | Status: DC
  Administered 2011-05-29: 21:00:00 via INTRAVENOUS

## 2011-05-29 NOTE — ED Notes (Signed)
Patient c/o nausea. Dr Judd Lien aware. Awaiting orders.

## 2011-05-29 NOTE — ED Notes (Addendum)
CRITICAL VALUE ALERT  Critical value received: Potassium  Date of notification:  05/29/11  Time of notification:  1605  Critical value read back:yes  Nurse who received alert:  Lake Bells, RN  MD notified (1st page):  Dr Judd Lien at [phone]

## 2011-05-29 NOTE — ED Provider Notes (Signed)
History    Scribed for Geoffery Lyons, MD, the patient was seen in room APA12/APA12. This chart was scribed by Katha Cabal.   CSN: [id number]  Arrival date & time 05/29/11  1421   First MD Initiated Contact with Patient 05/29/11 1441      Chief Complaint  Patient presents with  . Emesis    (Consider location/radiation/quality/duration/timing/severity/associated sxs/prior Treatment) Pt was seen at 2:44 PM   Patient is a 41 y.o. female presenting with vomiting. The history is provided by the patient. No language interpreter was used.  Emesis  This is a new problem. The current episode started yesterday. The problem occurs continuously. The problem has not changed since onset.The emesis has an appearance of stomach contents. There has been no fever. Pertinent negatives include no abdominal pain and no diarrhea. Associated symptoms comments: Fingertip nunbness.   Patient complains of persistent nausea for last week.   No recent sick contacts. Patient with history of diabetes mellitus and hypertension but is not compliant with medications.  Patient has not checked her blood sugar.  Patient states she was hospitalized for similar symptoms in 2012 when she found out about her diabetes.      Past Medical History  Diagnosis Date  . Hypertension   . Diabetes mellitus     History reviewed. No pertinent past surgical history.  No family history on file.  History  Substance Use Topics  . Smoking status: Former Games developer  . Smokeless tobacco: Not on file  . Alcohol Use: No    OB History    Grav Para Term Preterm Abortions TAB SAB Ect Mult Living                  Review of Systems  Gastrointestinal: Positive for vomiting. Negative for abdominal pain and diarrhea.  All other systems reviewed and are negative.    Allergies  Review of patient's allergies indicates no known allergies.  Home Medications  No current outpatient prescriptions on file.  BP 173/112  Pulse 123   Temp 97.9 F (36.6 C)  Resp 20  Ht 5\' 8"  (1.727 m)  Wt 170 lb (77.111 kg)  BMI 25.85 kg/m2  SpO2 99%  LMP 05/23/2011  Physical Exam  Nursing note and vitals reviewed. Constitutional: She is oriented to person, place, and time. She appears well-developed and well-nourished. No distress.  HENT:  Head: Normocephalic and atraumatic.  Mouth/Throat: Mucous membranes are dry.  Eyes: Conjunctivae and EOM are normal.  Neck: Neck supple.  Cardiovascular: Regular rhythm.  Tachycardia present.   No murmur heard. Pulmonary/Chest: Effort normal and breath sounds normal. No respiratory distress.  Abdominal: Soft. There is no tenderness. There is no rebound and no guarding.  Musculoskeletal: Normal range of motion. She exhibits no edema.  Neurological: She is alert and oriented to person, place, and time. No sensory deficit. Coordination normal.  Skin: Skin is warm and dry. No rash noted.       Poor turgor     Psychiatric: She has a normal mood and affect. Her behavior is normal.    ED Course  Procedures (including critical care time)   DIAGNOSTIC STUDIES: Oxygen Saturation is 99% on room air, normal by my interpretation.     COORDINATION OF CARE: 2:48 PM  Patient's blood sugar is 477.  Patient hyperglycemic.    2:51 PM  Physical exam complete.   IV fluids.     LABS / RADIOLOGY:   Labs Reviewed  GLUCOSE, CAPILLARY - Abnormal; Notable for  the following:    Glucose-Capillary 477 (*)    All other components within normal limits  CBC  DIFFERENTIAL  COMPREHENSIVE METABOLIC PANEL  KETONES, QUALITATIVE  URINALYSIS, ROUTINE W REFLEX MICROSCOPIC   No results found.    Date: 05/29/2011  Rate: 113  Rhythm: sinus tachycardia  QRS Axis: left  Intervals: normal  ST/T Wave abnormalities: normal  Conduction Disutrbances:none  Narrative Interpretation:   Old EKG Reviewed: unchanged      MDM  The labs show that she is in diabetic ketoacidosis.  She will be admitted to the triad  service.         MEDICATIONS GIVEN IN THE E.D. Scheduled Meds:    . sodium chloride   Intravenous Once   Continuous Infusions:      IMPRESSION: No diagnosis found.   NEW MEDICATIONS: New Prescriptions   No medications on file      I personally performed the services described in this documentation, which was scribed in my presence. The recorded information has been reviewed and considered.             Geoffery Lyons, MD 05/29/11 [phone]

## 2011-05-29 NOTE — H&P (Signed)
Hospital Admission Note Date: 05/29/2011  Patient name: Lauren Steele Medical record number: [id number] Date of birth: 1970-03-19 Age: 41 y.o. Gender: female PCP: Lilyan Punt, MD, MD  Attending physician: Geoffery Lyons, MD  Chief Complaint: Vomiting.  History of Present Illness:  Lauren Steele is an 41 y.o. female with type 2 diabetes who presents with vomiting since yesterday. She's vomited countless times. Eventually a blood-tinged emesis. No diarrhea. No fevers chills or pain. She stopped taking her medications "a long time ago", because "she was feeling better". She has not followed up with Dr. Gerda Diss for some time. She has a history of biliary dyskinesia but never followed up with the surgeon. She also has had several admissions with concern of gastroparesis, but she never followed up for a gastric emptying study. In the emergency room, she was found to have a blood glucose of over 400. Anion gap of 22. Bicarbonate of 12. Low potassium.  Past Medical History  Diagnosis Date  . Hypertension   . Diabetes mellitus    biliary dyskinesia  Meds: Prilosec  Allergies: Review of patient's allergies indicates no known allergies. History   Social History  . Marital Status: Married    Spouse Name: N/A    Number of Children: N/A  . Years of Education: N/A   Occupational History  . Not on file.   Social History Main Topics  . Smoking status: Former Games developer  . Smokeless tobacco: Not on file  . Alcohol Use: No  . Drug Use: No  . Sexually Active:    Other Topics Concern  . Not on file   Social History Narrative  . No narrative on file   Family history reviewed. As per previous H&P. History reviewed. No pertinent past surgical history.  Review of Systems: Systems reviewed and as per HPI, otherwise negative.  Physical Exam: Blood pressure 173/112, pulse 123, temperature 97.9 F (36.6 C), resp. rate 20, height 5\' 8"  (1.727 m), weight 77.111 kg (170 lb), last  menstrual period 05/23/2011, SpO2 99.00%. BP 173/112  Pulse 123  Temp 97.9 F (36.6 C)  Resp 20  Ht 5\' 8"  (1.727 m)  Wt 77.111 kg (170 lb)  BMI 25.85 kg/m2  SpO2 99%  LMP 05/23/2011  General Appearance:    Alert, cooperative, uncomfortable with the emesis bag at her side   Head:    Normocephalic, without obvious abnormality, atraumatic  Eyes:    PERRL, conjunctiva/corneas clear, EOM's intact, fundi    benign, both eyes     Nose:   Nares normal, septum midline, mucosa normal, no drainage    or sinus tenderness  Throat:   dry mucous membranes. No blood present. No thrush.   Neck:   Supple, symmetrical, trachea midline, no adenopathy;    thyroid:  no enlargement/tenderness/nodules; no carotid   bruit or JVD  Back:     Symmetric, no curvature, ROM normal, no CVA tenderness  Lungs:     Clear to auscultation bilaterally, respirations unlabored  Chest Wall:    No tenderness or deformity   Heart:    Regular rate and rhythm, S1 and S2 normal, no murmur, rub   or gallop     Abdomen:     Soft, non-tender, bowel sounds active all four quadrants,    no masses, no organomegaly  Genitalia:   deferred   Rectal:   deferred   Extremities:   Extremities normal, atraumatic, no cyanosis or edema  Pulses:   2+ and symmetric all extremities  Skin:  Skin color, texture, poor turgor, no rashes or lesions  Lymph nodes:   Cervical, supraclavicular, and axillary nodes normal  Neurologic:   CNII-XII intact, normal strength, sensation and reflexes    throughout    Psychiatric: Normal affect  Lab results: Basic Metabolic Panel:  Basename 05/29/11 1535  NA 139  K 2.6*  CL 95*  CO2 12*  GLUCOSE 476*  BUN 7  CREATININE 0.75  CALCIUM 10.1  MG --  PHOS --   Liver Function Tests:  Basename 05/29/11 1535  AST 8  ALT 7  ALKPHOS 117  BILITOT 0.3  PROT 8.7*  ALBUMIN 2.7*   No results found for this basename: LIPASE:2,AMYLASE:2 in the last 72 hours No results found for this basename:  AMMONIA:2 in the last 72 hours CBC:  Basename 05/29/11 1535  WBC 13.3*  NEUTROABS 11.3*  HGB 12.7  HCT 35.8*  MCV 85.0  PLT 541*   Alcohol Level: No results found for this basename: ETH:2 in the last 72 hours Urinalysis: No results found for this basename: COLORURINE:2,APPERANCEUR:2,LABSPEC:2,PHURINE:2,GLUCOSEU:2,HGBUR:2,BILIRUBINUR:2,KETONESUR:2,PROTEINUR:2,UROBILINOGEN:2,NITRITE:2,LEUKOCYTESUR:2 in the last 72 hours  Imaging results:  No results found.  Assessment & Plan: Principal Problem:  *DKA, type 2 Active Problems:  Hypokalemia  Hematemesis  history biliary dyskinesia Medical noncompliance  Patient will be started on an insulin drip. Admit to step down. Check magnesium, phosphorus, ABG. Serial be metastases. I suspect her hematemesis is from Mallory-Weiss tear or esophagitis. Monitor her hemoglobins and give twice daily protonic. Ice chips only for now. Replete potassium IV. Diabetic educator for teaching.  Ossiel Marchio L 05/29/2011, 4:59 PM

## 2011-05-29 NOTE — ED Notes (Addendum)
Pt c/o nausea since Monday with vomiting since yesterday. Denies pain.  Pt states she has dm and htn and has not taken her medication x 3 months.

## 2011-05-29 NOTE — ED Notes (Signed)
Attempted to call report. Pam, RN to call back. 

## 2011-05-29 NOTE — ED Notes (Signed)
Report given to Pam, RN ICU. Ready to receive patient.  

## 2011-05-30 LAB — BASIC METABOLIC PANEL
BUN: 10 mg/dL (ref 6–23)
CO2: 20 mEq/L (ref 19–32)
Chloride: 111 mEq/L (ref 96–112)
Chloride: 110 mEq/L (ref 96–112)
Chloride: 107 mEq/L (ref 96–112)
Creatinine, Ser: 0.73 mg/dL (ref 0.50–1.10)
GFR calc Af Amer: >90 mL/min (ref >90)
GFR calc Af Amer: >90 mL/min (ref >90)
GFR calc Af Amer: >90 mL/min (ref >90)
GFR calc non Af Amer: >90 mL/min (ref >90)
GFR calc non Af Amer: >90 mL/min (ref >90)
Potassium: 3 mEq/L — ABNORMAL LOW (ref 3.5–5.1)
Potassium: 2.8 mEq/L — ABNORMAL LOW (ref 3.5–5.1)
Sodium: 144 mEq/L (ref 135–145)
Sodium: 145 mEq/L (ref 135–145)
Sodium: 143 mEq/L (ref 135–145)

## 2011-05-30 LAB — GLUCOSE, CAPILLARY
Glucose-Capillary: 156 mg/dL — ABNORMAL HIGH (ref 70–99)
Glucose-Capillary: 125 mg/dL — ABNORMAL HIGH (ref 70–99)
Glucose-Capillary: 144 mg/dL — ABNORMAL HIGH (ref 70–99)
Glucose-Capillary: 121 mg/dL — ABNORMAL HIGH (ref 70–99)
Glucose-Capillary: 164 mg/dL — ABNORMAL HIGH (ref 70–99)
Glucose-Capillary: 375 mg/dL — ABNORMAL HIGH (ref 70–99)

## 2011-05-30 LAB — CBC
HCT: 32.8 % — ABNORMAL LOW (ref 36.0–46.0)
MCV: 83.9 fL (ref 78.0–100.0)
Platelets: 496 10*3/uL — ABNORMAL HIGH (ref 150–400)
RBC: 3.91 MIL/uL (ref 3.87–5.11)
RDW: 13.1 % (ref 11.5–15.5)
WBC: 11.5 10*3/uL — ABNORMAL HIGH (ref 4.0–10.5)

## 2011-05-30 LAB — URINALYSIS, ROUTINE W REFLEX MICROSCOPIC
Nitrite: NEGATIVE
Specific Gravity, Urine: 1.025 (ref 1.005–1.030)
Urobilinogen, UA: 0.2 mg/dL (ref 0.0–1.0)

## 2011-05-30 LAB — URINE MICROSCOPIC-ADD ON

## 2011-05-30 MED ORDER — BOOST / RESOURCE BREEZE PO LIQD
1.0000 | Freq: Every day | ORAL | Status: DC
  Administered 2011-05-31 – 2011-06-01 (×2): 1 via ORAL
  Filled 2011-05-30 (×6): qty 1

## 2011-05-30 MED ORDER — SODIUM CHLORIDE 0.9 % IJ SOLN
INTRAMUSCULAR | Status: AC
  Filled 2011-05-30: qty 3

## 2011-05-30 MED ORDER — PROMETHAZINE HCL 25 MG/ML IJ SOLN
12.5000 mg | Freq: Once | INTRAMUSCULAR | Status: AC
  Administered 2011-05-30: 12.5 mg via INTRAVENOUS

## 2011-05-30 MED ORDER — INSULIN ASPART 100 UNIT/ML ~~LOC~~ SOLN
0.0000 [IU] | Freq: Every day | SUBCUTANEOUS | Status: DC
  Administered 2011-05-31: 2 [IU] via SUBCUTANEOUS

## 2011-05-30 MED ORDER — SODIUM CHLORIDE 0.9 % IJ SOLN
INTRAMUSCULAR | Status: AC
  Administered 2011-05-30: 09:00:00
  Filled 2011-05-30: qty 6

## 2011-05-30 MED ORDER — SODIUM CHLORIDE 0.9 % IJ SOLN
INTRAMUSCULAR | Status: AC
  Administered 2011-05-30: 08:00:00
  Filled 2011-05-30: qty 3

## 2011-05-30 MED ORDER — POTASSIUM CHLORIDE 10 MEQ/100ML IV SOLN
10.0000 meq | INTRAVENOUS | Status: AC
  Administered 2011-05-30 (×2): 10 meq via INTRAVENOUS
  Filled 2011-05-30: qty 200

## 2011-05-30 MED ORDER — HYDROMORPHONE HCL PF 1 MG/ML IJ SOLN
0.5000 mg | INTRAMUSCULAR | Status: DC | PRN
  Administered 2011-05-30 – 2011-06-04 (×17): 0.5 mg via INTRAVENOUS
  Filled 2011-05-30 (×17): qty 1

## 2011-05-30 MED ORDER — SODIUM CHLORIDE 0.9 % IJ SOLN
INTRAMUSCULAR | Status: AC
  Administered 2011-05-30: 11:00:00
  Filled 2011-05-30: qty 3

## 2011-05-30 MED ORDER — INSULIN ASPART 100 UNIT/ML ~~LOC~~ SOLN
0.0000 [IU] | Freq: Three times a day (TID) | SUBCUTANEOUS | Status: DC
  Administered 2011-05-30: 15 [IU] via SUBCUTANEOUS
  Administered 2011-05-31 (×2): 3 [IU] via SUBCUTANEOUS
  Administered 2011-05-31: 5 [IU] via SUBCUTANEOUS
  Administered 2011-06-01 (×3): 3 [IU] via SUBCUTANEOUS
  Administered 2011-06-02: 2 [IU] via SUBCUTANEOUS

## 2011-05-30 MED ORDER — INSULIN GLARGINE 100 UNIT/ML ~~LOC~~ SOLN
10.0000 [IU] | Freq: Once | SUBCUTANEOUS | Status: AC
  Administered 2011-05-30: 10 [IU] via SUBCUTANEOUS

## 2011-05-30 MED ORDER — INSULIN GLARGINE 100 UNIT/ML ~~LOC~~ SOLN
10.0000 [IU] | Freq: Every day | SUBCUTANEOUS | Status: DC
  Administered 2011-05-30: 10 [IU] via SUBCUTANEOUS

## 2011-05-30 MED ORDER — SODIUM CHLORIDE 0.9 % IJ SOLN
INTRAMUSCULAR | Status: AC
  Administered 2011-05-30: 20 mL
  Filled 2011-05-30: qty 3

## 2011-05-30 NOTE — Progress Notes (Signed)
Subjective: Dry heaves. No Hematemesis or melena. No pain  Objective: Vital signs in last 24 hours: Filed Vitals:   05/30/11 0300 05/30/11 0400 05/30/11 0500 05/30/11 0600  BP: 117/72 125/74 155/87 152/91  Pulse: 101 100 103 96  Temp:  98 F (36.7 C)    TempSrc:  Oral    Resp: 19 18 16 16   Height:      Weight:   75.7 kg (166 lb 14.2 oz)   SpO2: 96% 98% 98% 98%   Weight change:   Intake/Output Summary (Last 24 hours) at 05/30/11 0812 Last data filed at 05/30/11 0102  Gross per 24 hour  Intake 1584.46 ml  Output   1650 ml  Net -65.54 ml   Physical Exam:  Gen: dry heaves Lungs: Clear to auscultation bilaterally without wheezes rhonchi or rales Cardiovascular regular rate rhythm without murmurs gallops rubs Abdomen soft nontender nondistended Extremities no clubbing cyanosis or edema  Lab Results: Basic Metabolic Panel:  Lab 05/30/11 7253 05/29/11 2103 05/29/11 1535  NA 144 143 --  K 3.0* 2.6* --  CL 111 106 --  CO2 21 17* --  GLUCOSE 157* 213* --  BUN 8 7 --  CREATININE 0.65 0.66 --  CALCIUM 9.5 9.7 --  MG -- -- 1.9  PHOS -- -- 4.0   Liver Function Tests:  Lab 05/29/11 1535  AST 8  ALT 7  ALKPHOS 117  BILITOT 0.3  PROT 8.7*  ALBUMIN 2.7*    Lab 05/29/11 1535  LIPASE 34  AMYLASE --   No results found for this basename: AMMONIA:2 in the last 168 hours CBC:  Lab 05/30/11 0306 05/29/11 1535  WBC 11.5* 13.3*  NEUTROABS -- 11.3*  HGB 11.7* 12.7  HCT 32.8* 35.8*  MCV 83.9 85.0  PLT 496* 541*   Cardiac Enzymes: No results found for this basename: CKTOTAL:3,CKMB:3,CKMBINDEX:3,TROPONINI:3 in the last 168 hours BNP: No results found for this basename: PROBNP:3 in the last 168 hours D-Dimer: No results found for this basename: DDIMER:2 in the last 168 hours CBG:  Lab 05/30/11 0637 05/30/11 0536 05/30/11 0429 05/30/11 0330 05/30/11 0231 05/30/11 0131  GLUCAP 134* 121* 125* 144* 159* 156*   Hemoglobin A1C: No results found for this basename:  HGBA1C in the last 168 hours Fasting Lipid Panel: No results found for this basename: CHOL,HDL,LDLCALC,TRIG,CHOLHDL,LDLDIRECT in the last 664 hours Thyroid Function Tests: No results found for this basename: TSH,T4TOTAL,FREET4,T3FREE,THYROIDAB in the last 168 hours Coagulation: No results found for this basename: LABPROT:4,INR:4 in the last 168 hours Anemia Panel: No results found for this basename: VITAMINB12,FOLATE,FERRITIN,TIBC,IRON,RETICCTPCT in the last 168 hours Urine Drug Screen: Drugs of Abuse    Alcohol Level: No results found for this basename: ETH:2 in the last 168 hours Urinalysis: No results found for this basename: COLORURINE:2,APPERANCEUR:2,LABSPEC:2,PHURINE:2,GLUCOSEU:2,HGBUR:2,BILIRUBINUR:2,KETONESUR:2,PROTEINUR:2,UROBILINOGEN:2,NITRITE:2,LEUKOCYTESUR:2 in the last 168 hours Not collected.  Micro Results: Recent Results (from the past 240 hour(s))  MRSA PCR SCREENING     Status: Abnormal   Collection Time   05/29/11  6:45 PM      Component Value Range Status Comment   MRSA by PCR INVALID RESULTS, SPECIMEN SENT FOR CULTURE (*) NEGATIVE  Final    Studies/Results: No results found.  Scheduled Meds:   . sodium chloride   Intravenous Once  . influenza  inactive virus vaccine  0.5 mL Intramuscular Tomorrow-1000  . insulin aspart  5 Units Intravenous Once  . insulin glargine  10 Units Subcutaneous Once  . metoCLOPramide (REGLAN) injection  10 mg Intravenous Q6H  .  metoprolol  5 mg Intravenous Q6H  . ondansetron      . pantoprazole (PROTONIX) IV  40 mg Intravenous Q12H  . pneumococcal 23 valent vaccine  0.5 mL Intramuscular Tomorrow-1000  . potassium chloride  10 mEq Intravenous Q1H  . potassium chloride  10 mEq Intravenous Q1 Hr x 2  . potassium chloride  40 mEq Oral Q4H  . promethazine  12.5 mg Intravenous Once  . promethazine  12.5 mg Intravenous Once  . sodium chloride       Continuous Infusions:   . 0.9 % NaCl with KCl 40 mEq / Steele Stopped (05/29/11 2054)    . insulin (NOVOLIN-R) infusion 0.7 mL/hr at 05/30/11 1610  . DISCONTD: sodium chloride 200 mL/hr at 05/29/11 1710  . DISCONTD: dextrose 5 % and 0.45 % NaCl with KCl 20 mEq/Steele 125 mL/hr at 05/30/11 0609  . DISCONTD: dextrose 5 % and 0.45% NaCl 125 mL/hr at 05/29/11 2200   PRN Meds:.acetaminophen, acetaminophen, dextrose, promethazine Assessment/Plan: Principal Problem:  *DKA, type 2, anion gap closed. Will give Lantus insulin, stop D5, stop insulin drip. Sliding scale Humalog. Active Problems:  Hypokalemia: Replete IV  Hematemesis, limited, with stable blood pressure and hemoglobin. Continue proton X. Vomiting continues. Continue scheduled Reglan and Phenergan as needed. Noncompliance  LOS: 1 day   Lauren Steele 05/30/2011, 8:12 AM

## 2011-05-30 NOTE — Progress Notes (Signed)
Glycemic Control Recommendations    Patient admitted for DKA.  Patient stated that she used to take Novolog and Lantus, but then was taken off insulin per her PCP and switched to Metformin.  However patient stated "it hurt my stomach."  She said that she quit taking the medication subsequently.       Spoke with patient about the importance of always taking medication for her diabetes and checking her glucose.  I informed her that there were other alternative medications that could be given that may not have the side effects.  Patient stated that "it is my fault for not following up."   We talked about how she can start taking better care of herself now and not dwell on past.   Gave patient small booklet on importance of taking care of her Diabetes as well as information on free outpatient Diabetes education.   I would recommend checking HgbA1C to assess glycemic control. If greater than 10%, would recommend starting patient back on insulin for initial management.  I also would recommend dual oral agent therapy such as GLP-1 or DPP4 inhibitor (i.e. Januvia or Tradjenta) and sulfonylurea (i.e. Amaryl).   Would refrain from Metformin due to past side effects.

## 2011-05-30 NOTE — Progress Notes (Signed)
INITIAL ADULT NUTRITION ASSESSMENT Date: 05/30/2011   Time: 11:08 AM Reason for Assessment: Screened for nutrition risk, unintentional weight loss  Of > 10 lb. In one month  ASSESSMENT: Female 41 y.o.  Dx: DKA, type 2  Hx:  Past Medical History  Diagnosis Date  . Hypertension   . Diabetes mellitus    Related Meds:  Scheduled Meds:   . sodium chloride   Intravenous Once  . influenza  inactive virus vaccine  0.5 mL Intramuscular Tomorrow-1000  . insulin aspart  0-15 Units Subcutaneous TID WC  . insulin aspart  0-5 Units Subcutaneous QHS  . insulin aspart  5 Units Intravenous Once  . insulin glargine  10 Units Subcutaneous Once  . insulin glargine  10 Units Subcutaneous QHS  . metoCLOPramide (REGLAN) injection  10 mg Intravenous Q6H  . metoprolol  5 mg Intravenous Q6H  . ondansetron      . pantoprazole (PROTONIX) IV  40 mg Intravenous Q12H  . pneumococcal 23 valent vaccine  0.5 mL Intramuscular Tomorrow-1000  . potassium chloride  10 mEq Intravenous Q1H  . potassium chloride  10 mEq Intravenous Q1 Hr x 2  . potassium chloride  40 mEq Oral Q4H  . promethazine  12.5 mg Intravenous Once  . promethazine  12.5 mg Intravenous Once  . sodium chloride      . sodium chloride      . sodium chloride       Continuous Infusions:   . 0.9 % NaCl with KCl 40 mEq / L 125 mL/hr at 05/30/11 0834  . insulin (NOVOLIN-R) infusion 0.7 mL/hr at 05/30/11 1610  . DISCONTD: sodium chloride 200 mL/hr at 05/29/11 1710  . DISCONTD: dextrose 5 % and 0.45 % NaCl with KCl 20 mEq/L 125 mL/hr at 05/30/11 0609  . DISCONTD: dextrose 5 % and 0.45% NaCl 125 mL/hr at 05/29/11 2200   PRN Meds:.acetaminophen, acetaminophen, dextrose, promethazine  Ht: 5\' 8"  (172.7 cm)  Wt: 166 lb 14.2 oz (75.7 kg)  Ideal Wt: 63.6 kg % Ideal Wt: 118.5%  Usual Wt: unknown  Body mass index is 25.38 kg/(m^2). Overweight  Food/Nutrition Related Hx: Patient reports weight loss over the past week but unable to quantify.  Patient is unsure of UBW. Patient with poor appetite. Patient consuming 20% of clear liquid diet at meals. Patient received diabetes booklet from the diabetes coordinator and is without any nutrition related questions at this time. Encouraged PO intake.   Labs:  CMP     Component Value Date/Time   NA 145 05/30/2011 0810   K 3.0* 05/30/2011 0810   CL 110 05/30/2011 0810   CO2 20 05/30/2011 0810   GLUCOSE 214* 05/30/2011 0810   BUN 9 05/30/2011 0810   CREATININE 0.73 05/30/2011 0810   CALCIUM 9.6 05/30/2011 0810   PROT 8.7* 05/29/2011 1535   ALBUMIN 2.7* 05/29/2011 1535   AST 8 05/29/2011 1535   ALT 7 05/29/2011 1535   ALKPHOS 117 05/29/2011 1535   BILITOT 0.3 05/29/2011 1535   GFRNONAA >90 05/30/2011 0810   GFRAA >90 05/30/2011 0810    Intake/Output Summary (Last 24 hours) at 05/30/11 1112 Last data filed at 05/30/11 9604  Gross per 24 hour  Intake 1584.46 ml  Output   1650 ml  Net -65.54 ml    Diet Order: Clear Liquid  Supplements/Tube Feeding: none at this time  IVF:    0.9 % NaCl with KCl 40 mEq / L Last Rate: 125 mL/hr at 05/30/11 0834  insulin (  NOVOLIN-R) infusion Last Rate: 0.7 mL/hr at 05/30/11 9604  DISCONTD: sodium chloride Last Rate: 200 mL/hr at 05/29/11 1710  DISCONTD: dextrose 5 % and 0.45 % NaCl with KCl 20 mEq/L Last Rate: 125 mL/hr at 05/30/11 5409  DISCONTD: dextrose 5 % and 0.45% NaCl Last Rate: 125 mL/hr at 05/29/11 2200    Estimated Nutritional Needs:   Kcal: 8119-1478 Protein: 83-98 grams Fluid: 1 ml per kcal  NUTRITION DIAGNOSIS: -Inadequate oral intake (NI-2.1).  Status: Ongoing  RELATED TO: poor appetite  AS EVIDENCE BY: patient with poor PO intake of 0-20% at meals.   MONITORING/EVALUATION(Goals): Labs, PO intake, Weight trends, I/O's, Diet advancement 1. PO intake greater than 75% at meals and supplements 2. Promote weight maintenance. 3. Meet > 90% of estimated nutrition needs  EDUCATION NEEDS: -Education needs addressed  INTERVENTION: 1.  Will order patient [employer] nutrition supplement once daily while on clear liquid diet. Provides 250 kcal and 9 grams protein.   2. Will monitor for further diet education needs.  3. RD to follow for nutrition plan of care.   Dietitian [phone]  DOCUMENTATION CODES Per approved criteria  -Not Applicable    Iven Finn [facility] 05/30/2011, 11:08 AM

## 2011-05-31 DIAGNOSIS — I10 Essential (primary) hypertension: Secondary | ICD-10-CM | POA: Diagnosis present

## 2011-05-31 DIAGNOSIS — Z91199 Patient's noncompliance with other medical treatment and regimen due to unspecified reason: Secondary | ICD-10-CM

## 2011-05-31 DIAGNOSIS — Z9119 Patient's noncompliance with other medical treatment and regimen: Secondary | ICD-10-CM

## 2011-05-31 DIAGNOSIS — L0231 Cutaneous abscess of buttock: Secondary | ICD-10-CM | POA: Diagnosis present

## 2011-05-31 LAB — BASIC METABOLIC PANEL
GFR calc Af Amer: >90 mL/min (ref >90)
GFR calc non Af Amer: >90 mL/min (ref >90)
Potassium: 3.4 mEq/L — ABNORMAL LOW (ref 3.5–5.1)
Sodium: 144 mEq/L (ref 135–145)

## 2011-05-31 LAB — URINALYSIS, ROUTINE W REFLEX MICROSCOPIC
Glucose, UA: 500 mg/dL — AB
Leukocytes, UA: NEGATIVE
Nitrite: NEGATIVE
Specific Gravity, Urine: 1.025 (ref 1.005–1.030)
pH: 6 (ref 5.0–8.0)

## 2011-05-31 LAB — GLUCOSE, CAPILLARY: Glucose-Capillary: 115 mg/dL — ABNORMAL HIGH (ref 70–99)

## 2011-05-31 LAB — CBC
Hemoglobin: 12.7 g/dL (ref 12.0–15.0)
MCHC: 35.2 g/dL (ref 30.0–36.0)
RDW: 13.4 % (ref 11.5–15.5)

## 2011-05-31 LAB — URINE MICROSCOPIC-ADD ON

## 2011-05-31 MED ORDER — DILTIAZEM HCL ER COATED BEADS 180 MG PO CP24
180.0000 mg | ORAL_CAPSULE | Freq: Every day | ORAL | Status: DC
  Administered 2011-05-31 – 2011-06-04 (×5): 180 mg via ORAL
  Filled 2011-05-31 (×5): qty 1

## 2011-05-31 MED ORDER — POTASSIUM CHLORIDE IN NACL 40-0.9 MEQ/L-% IV SOLN
INTRAVENOUS | Status: AC
  Filled 2011-05-31: qty 1000

## 2011-05-31 MED ORDER — SODIUM CHLORIDE 0.9 % IJ SOLN
INTRAMUSCULAR | Status: AC
  Filled 2011-05-31: qty 3

## 2011-05-31 MED ORDER — PANTOPRAZOLE SODIUM 40 MG PO TBEC
40.0000 mg | DELAYED_RELEASE_TABLET | Freq: Every day | ORAL | Status: DC
  Administered 2011-05-31 – 2011-06-04 (×5): 40 mg via ORAL
  Filled 2011-05-31 (×5): qty 1

## 2011-05-31 MED ORDER — CEFTAROLINE FOSAMIL 600 MG IV SOLR
INTRAVENOUS | Status: AC
  Filled 2011-05-31: qty 600

## 2011-05-31 MED ORDER — LISINOPRIL 10 MG PO TABS
10.0000 mg | ORAL_TABLET | Freq: Every day | ORAL | Status: DC
  Administered 2011-05-31 – 2011-06-01 (×2): 10 mg via ORAL
  Filled 2011-05-31 (×2): qty 1

## 2011-05-31 MED ORDER — INSULIN GLARGINE 100 UNIT/ML ~~LOC~~ SOLN
16.0000 [IU] | Freq: Every day | SUBCUTANEOUS | Status: DC
  Administered 2011-05-31: 16 [IU] via SUBCUTANEOUS

## 2011-05-31 MED ORDER — CEFTAROLINE FOSAMIL 600 MG IV SOLR
600.0000 mg | Freq: Two times a day (BID) | INTRAVENOUS | Status: DC
  Administered 2011-05-31 – 2011-06-04 (×9): 600 mg via INTRAVENOUS
  Filled 2011-05-31 (×11): qty 600

## 2011-05-31 MED ORDER — SODIUM CHLORIDE 0.9 % IJ SOLN
INTRAMUSCULAR | Status: AC
  Filled 2011-05-31: qty 6

## 2011-05-31 NOTE — Progress Notes (Signed)
Subjective: Vomiting has resolved, but has nausea when she drinks too much liquids too quickly. Overall, feels better. Had a "boil" on her buttock that started a week ago, burst last night, and is now draining.  Objective: Vital signs in last 24 hours: Filed Vitals:   05/31/11 0400 05/31/11 0429 05/31/11 0500 05/31/11 0600  BP: 178/106  182/123 179/119  Pulse: 115  116 101  Temp: 99.1 F (37.3 C)     TempSrc: Oral     Resp: 20  21 25   Height:      Weight:  78.9 kg (173 lb 15.1 oz)    SpO2: 99%  99% 99%   Weight change: 1.789 kg (3 lb 15.1 oz)  Intake/Output Summary (Last 24 hours) at 05/31/11 0826 Last data filed at 05/31/11 0600  Gross per 24 hour  Intake   3173 ml  Output    876 ml  Net   2297 ml   Physical Exam:  Gen: Brighter, more talkative, more comfortable. Lungs: Clear to auscultation bilaterally without wheezes rhonchi or rales Cardiovascular regular rate rhythm without murmurs gallops rubs Abdomen soft nontender nondistended Extremities no clubbing cyanosis or edema Skin: 2-3 cm wound at the gluteal cleft which is draining purulent fluid. Surrounding skin is indurated with multiple smaller areas of drainage  Lab Results: Basic Metabolic Panel:  Lab 05/31/11 6295 05/30/11 1108 05/29/11 1535  NA 144 143 --  K 3.4* 2.8* --  CL 110 107 --  CO2 20 19 --  GLUCOSE 238* 269* --  BUN 7 10 --  CREATININE 0.64 0.79 --  CALCIUM 9.2 9.7 --  MG -- -- 1.9  PHOS -- -- 4.0   Liver Function Tests:  Lab 05/29/11 1535  AST 8  ALT 7  ALKPHOS 117  BILITOT 0.3  PROT 8.7*  ALBUMIN 2.7*    Lab 05/29/11 1535  LIPASE 34  AMYLASE --   No results found for this basename: AMMONIA:2 in the last 168 hours CBC:  Lab 05/31/11 0525 05/30/11 0306 05/29/11 1535  WBC 8.5 11.5* --  NEUTROABS -- -- 11.3*  HGB 12.7 11.7* --  HCT 36.1 32.8* --  MCV 85.7 83.9 --  PLT 481* 496* --   Cardiac Enzymes: No results found for this basename:  CKTOTAL:3,CKMB:3,CKMBINDEX:3,TROPONINI:3 in the last 168 hours BNP: No results found for this basename: PROBNP:3 in the last 168 hours D-Dimer: No results found for this basename: DDIMER:2 in the last 168 hours CBG:  Lab 05/30/11 2024 05/30/11 1711 05/30/11 0959 05/30/11 0848 05/30/11 0743 05/30/11 0637  GLUCAP 115* 375* 198* 176* 164* 134*   Hemoglobin A1C: No results found for this basename: HGBA1C in the last 168 hours Fasting Lipid Panel: No results found for this basename: CHOL,HDL,LDLCALC,TRIG,CHOLHDL,LDLDIRECT in the last 284 hours Thyroid Function Tests: No results found for this basename: TSH,T4TOTAL,FREET4,T3FREE,THYROIDAB in the last 168 hours Coagulation: No results found for this basename: LABPROT:4,INR:4 in the last 168 hours Anemia Panel: No results found for this basename: VITAMINB12,FOLATE,FERRITIN,TIBC,IRON,RETICCTPCT in the last 168 hours Urine Drug Screen: Drugs of Abuse    Alcohol Level: No results found for this basename: ETH:2 in the last 168 hours Urinalysis:  Lab 05/31/11 0348 05/30/11 1713  COLORURINE YELLOW AMBER*  LABSPEC 1.025 1.025  PHURINE 6.0 6.5  GLUCOSEU 500* 500*  HGBUR LARGE* MODERATE*  BILIRUBINUR SMALL* SMALL*  KETONESUR 15* >80*  PROTEINUR 100* 100*  UROBILINOGEN 0.2 0.2  NITRITE NEGATIVE NEGATIVE  LEUKOCYTESUR NEGATIVE NEGATIVE   Not collected.  Micro Results: Recent  Results (from the past 240 hour(s))  WOUND CULTURE     Status: Normal (Preliminary result)   Collection Time   05/29/11  6:44 PM      Component Value Range Status Comment   Specimen Description WOUND   Final    Special Requests Normal   Final    Gram Stain     Final    Value: NO WBC SEEN     NO SQUAMOUS EPITHELIAL CELLS SEEN     MODERATE GRAM POSITIVE COCCI IN PAIRS   Culture Culture reincubated for better growth   Final    Report Status PENDING   Incomplete   MRSA PCR SCREENING     Status: Abnormal   Collection Time   05/29/11  6:45 PM      Component Value  Range Status Comment   MRSA by PCR INVALID RESULTS, SPECIMEN SENT FOR CULTURE (*) NEGATIVE  Final    Studies/Results: No results found.  Scheduled Meds:    . ceFTAROline (TEFLARO) IV  600 mg Intravenous Q12H  . diltiazem  180 mg Oral Daily  . feeding supplement  1 Container Oral Q1500  . influenza  inactive virus vaccine  0.5 mL Intramuscular Tomorrow-1000  . insulin aspart  0-15 Units Subcutaneous TID WC  . insulin aspart  0-5 Units Subcutaneous QHS  . insulin glargine  10 Units Subcutaneous Once  . insulin glargine  16 Units Subcutaneous QHS  . lisinopril  10 mg Oral Daily  . metoCLOPramide (REGLAN) injection  10 mg Intravenous Q6H  . pantoprazole  40 mg Oral Q1200  . pneumococcal 23 valent vaccine  0.5 mL Intramuscular Tomorrow-1000  . potassium chloride  10 mEq Intravenous Q1 Hr x 2  . sodium chloride      . sodium chloride      . sodium chloride      . sodium chloride      . sodium chloride      . sodium chloride      . sodium chloride      . DISCONTD: insulin glargine  10 Units Subcutaneous QHS  . DISCONTD: metoprolol  5 mg Intravenous Q6H  . DISCONTD: pantoprazole (PROTONIX) IV  40 mg Intravenous Q12H   Continuous Infusions:    . 0.9 % NaCl with KCl 40 mEq / L 125 mL/hr (05/31/11 0344)  . insulin (NOVOLIN-R) infusion 0.7 mL/hr at 05/30/11 0638   PRN Meds:.acetaminophen, acetaminophen, dextrose, HYDROmorphone (DILAUDID) injection, promethazine Assessment/Plan: Principal Problem:  *DKA, type 2, resolved. Transfer to the floor. Increase Lantus to 16 units and continue sliding scale  Abscess of the buttocks, now draining. Unfortunately patient gave no history of this when I examined her. Nor do I see any note of this by the ED physician. She needs local wound care with packing and sitz baths. It appears to be draining fairly well in multiple areas, so no need for debridement at this time. I will start her on ceftaroline to cover staph and gram negatives. I see that a  wound culture was sent on the day of admission, results are pending.   Hypokalemia, improving  Vomiting resolved, but still slightly nauseated. Continue scheduled Reglan and Phenergan as needed. Advance diet  Malignant hypertension: Patient had been on lisinopril and diltiazem CD previously. I will resume this and decrease IV fluids.  Noncompliance  LOS: 2 days   Naiomy Watters L 05/31/2011, 8:26 AM

## 2011-05-31 NOTE — Progress Notes (Signed)
Pt transferred to room 336 at 3:30pm. Report given to Zannie Kehr RN. Pt hypertensive at transfer, accepting RN aware.

## 2011-06-01 LAB — GLUCOSE, CAPILLARY
Glucose-Capillary: 213 mg/dL — ABNORMAL HIGH (ref 70–99)
Glucose-Capillary: 195 mg/dL — ABNORMAL HIGH (ref 70–99)
Glucose-Capillary: 153 mg/dL — ABNORMAL HIGH (ref 70–99)

## 2011-06-01 LAB — BASIC METABOLIC PANEL
Chloride: 104 mEq/L (ref 96–112)
Creatinine, Ser: 0.56 mg/dL (ref 0.50–1.10)
GFR calc Af Amer: >90 mL/min (ref >90)
GFR calc non Af Amer: >90 mL/min (ref >90)
Potassium: 3.1 mEq/L — ABNORMAL LOW (ref 3.5–5.1)

## 2011-06-01 LAB — MRSA CULTURE

## 2011-06-01 MED ORDER — CHLORHEXIDINE GLUCONATE CLOTH 2 % EX PADS
6.0000 | MEDICATED_PAD | Freq: Every day | CUTANEOUS | Status: DC
  Administered 2011-06-02 – 2011-06-04 (×3): 6 via TOPICAL

## 2011-06-01 MED ORDER — SODIUM CHLORIDE 0.9 % IJ SOLN
INTRAMUSCULAR | Status: AC
  Administered 2011-06-01: 03:00:00
  Filled 2011-06-01: qty 3

## 2011-06-01 MED ORDER — POTASSIUM CHLORIDE CRYS ER 10 MEQ PO TBCR
10.0000 meq | EXTENDED_RELEASE_TABLET | Freq: Two times a day (BID) | ORAL | Status: DC
  Administered 2011-06-01 (×2): 10 meq via ORAL
  Filled 2011-06-01 (×2): qty 1

## 2011-06-01 MED ORDER — HYDRALAZINE HCL 20 MG/ML IJ SOLN
10.0000 mg | Freq: Four times a day (QID) | INTRAMUSCULAR | Status: DC | PRN
  Administered 2011-06-01 (×3): 10 mg via INTRAVENOUS
  Filled 2011-06-01 (×3): qty 1

## 2011-06-01 MED ORDER — MUPIROCIN 2 % EX OINT
1.0000 "application " | TOPICAL_OINTMENT | Freq: Two times a day (BID) | CUTANEOUS | Status: DC
  Administered 2011-06-01 – 2011-06-04 (×6): 1 via NASAL
  Filled 2011-06-01: qty 22

## 2011-06-01 MED ORDER — INSULIN GLARGINE 100 UNIT/ML ~~LOC~~ SOLN
20.0000 [IU] | Freq: Every day | SUBCUTANEOUS | Status: DC
  Administered 2011-06-01 – 2011-06-03 (×3): 20 [IU] via SUBCUTANEOUS

## 2011-06-01 MED ORDER — SODIUM CHLORIDE 0.9 % IJ SOLN
INTRAMUSCULAR | Status: AC
  Administered 2011-06-01: 10 mL
  Filled 2011-06-01: qty 3

## 2011-06-01 MED ORDER — METOCLOPRAMIDE HCL 10 MG PO TABS
10.0000 mg | ORAL_TABLET | Freq: Three times a day (TID) | ORAL | Status: DC
  Administered 2011-06-01 – 2011-06-04 (×10): 10 mg via ORAL
  Filled 2011-06-01 (×10): qty 1

## 2011-06-01 MED ORDER — LISINOPRIL 10 MG PO TABS
20.0000 mg | ORAL_TABLET | Freq: Every day | ORAL | Status: DC
  Administered 2011-06-01: 10 mg via ORAL
  Administered 2011-06-02 – 2011-06-04 (×3): 20 mg via ORAL
  Filled 2011-06-01 (×2): qty 2
  Filled 2011-06-01: qty 1
  Filled 2011-06-01: qty 2

## 2011-06-01 NOTE — Progress Notes (Signed)
Pt has a Boil that has opened on her upper buttock.  The area is tunneling and has white tissue that is present.  She also has another 0.5cm x 0.5cm area between the sacral crease that is also open and tunnels.  Both areas are draining copies amounts of blood tinged pus.  The tissue of the entire lower back and  down into the buttock area has hard areas that are believed also to be infected.  Sitz path was provided.  Then the  open areas were irrigated with normal saline and packed with wet to dry dressings.  K pad was also placed at the site.  With continue to monitor prn.

## 2011-06-01 NOTE — Progress Notes (Signed)
Nose culture 05/29/11 @ 345 pm has grown Meth-Resistance Staph Aureus per [facility] lab

## 2011-06-01 NOTE — Progress Notes (Signed)
Sitz bath completed. Wounds were irrigated with normal saline and w/d dressings were placed.  Wounds continue to drain pus/bloody drainage.

## 2011-06-01 NOTE — Progress Notes (Signed)
Subjective: Vomiting has resolved. Eating a small amount. Some nausea. Still with drainage from her wounds.  Objective: Vital signs in last 24 hours: Filed Vitals:   05/31/11 2111 06/01/11 0512 06/01/11 0652 06/01/11 0908  BP: 149/89 187/123 172/118 198/118  Pulse: 98 111    Temp: 98.4 F (36.9 C) 98.4 F (36.9 C)    TempSrc:  Oral    Resp: 20 20    Height:      Weight:      SpO2: 95% 93%     Weight change:   Intake/Output Summary (Last 24 hours) at 06/01/11 1155 Last data filed at 06/01/11 0900  Gross per 24 hour  Intake    980 ml  Output      0 ml  Net    980 ml   Physical Exam:   Lungs: Clear to auscultation bilaterally without wheezes rhonchi or rales Cardiovascular regular rate rhythm without murmurs gallops rubs Abdomen soft nontender nondistended Extremities no clubbing cyanosis or edema Skin: Multiple areas of drainage. Whitish slough. Not much tunneling between most of the areas. A few areas not draining yet.  Lab Results: Basic Metabolic Panel:  Lab 06/01/11 1610 05/31/11 0525 05/29/11 1535  NA 141 144 --  K 3.1* 3.4* --  CL 104 110 --  CO2 23 20 --  GLUCOSE 241* 238* --  BUN 4* 7 --  CREATININE 0.56 0.64 --  CALCIUM 8.5 9.2 --  MG -- -- 1.9  PHOS -- -- 4.0   Liver Function Tests:  Lab 05/29/11 1535  AST 8  ALT 7  ALKPHOS 117  BILITOT 0.3  PROT 8.7*  ALBUMIN 2.7*    Lab 05/29/11 1535  LIPASE 34  AMYLASE --   No results found for this basename: AMMONIA:2 in the last 168 hours CBC:  Lab 05/31/11 0525 05/30/11 0306 05/29/11 1535  WBC 8.5 11.5* --  NEUTROABS -- -- 11.3*  HGB 12.7 11.7* --  HCT 36.1 32.8* --  MCV 85.7 83.9 --  PLT 481* 496* --   Cardiac Enzymes: No results found for this basename: CKTOTAL:3,CKMB:3,CKMBINDEX:3,TROPONINI:3 in the last 168 hours BNP: No results found for this basename: PROBNP:3 in the last 168 hours D-Dimer: No results found for this basename: DDIMER:2 in the last 168 hours CBG:  Lab 06/01/11 0720  05/31/11 2111 05/31/11 1619 05/30/11 2024 05/30/11 1711 05/30/11 0959  GLUCAP 213* 223* 166* 115* 375* 198*   Hemoglobin A1C: No results found for this basename: HGBA1C in the last 168 hours Fasting Lipid Panel: No results found for this basename: CHOL,HDL,LDLCALC,TRIG,CHOLHDL,LDLDIRECT in the last 960 hours Thyroid Function Tests: No results found for this basename: TSH,T4TOTAL,FREET4,T3FREE,THYROIDAB in the last 168 hours Coagulation: No results found for this basename: LABPROT:4,INR:4 in the last 168 hours Anemia Panel: No results found for this basename: VITAMINB12,FOLATE,FERRITIN,TIBC,IRON,RETICCTPCT in the last 168 hours Urine Drug Screen: Drugs of Abuse    Alcohol Level: No results found for this basename: ETH:2 in the last 168 hours Urinalysis:  Lab 05/31/11 0348 05/30/11 1713  COLORURINE YELLOW AMBER*  LABSPEC 1.025 1.025  PHURINE 6.0 6.5  GLUCOSEU 500* 500*  HGBUR LARGE* MODERATE*  BILIRUBINUR SMALL* SMALL*  KETONESUR 15* >80*  PROTEINUR 100* 100*  UROBILINOGEN 0.2 0.2  NITRITE NEGATIVE NEGATIVE  LEUKOCYTESUR NEGATIVE NEGATIVE   Not collected.  Micro Results: Recent Results (from the past 240 hour(s))  WOUND CULTURE     Status: Normal (Preliminary result)   Collection Time   05/29/11  6:44 PM  Component Value Range Status Comment   Specimen Description WOUND   Final    Special Requests Normal   Final    Gram Stain     Final    Value: NO WBC SEEN     NO SQUAMOUS EPITHELIAL CELLS SEEN     MODERATE GRAM POSITIVE COCCI IN PAIRS   Culture     Final    Value: MODERATE STAPHYLOCOCCUS AUREUS     Note: RIFAMPIN AND GENTAMICIN SHOULD NOT BE USED AS SINGLE DRUGS FOR TREATMENT OF STAPH INFECTIONS.   Report Status PENDING   Incomplete   MRSA PCR SCREENING     Status: Abnormal   Collection Time   05/29/11  6:45 PM      Component Value Range Status Comment   MRSA by PCR INVALID RESULTS, SPECIMEN SENT FOR CULTURE (*) NEGATIVE  Final   MRSA CULTURE     Status:  Normal (Preliminary result)   Collection Time   05/29/11  6:45 PM      Component Value Range Status Comment   Specimen Description NOSE   Final    Special Requests NONE   Final    Culture Culture reincubated for better growth   Final    Report Status PENDING   Incomplete    Studies/Results: No results found.  Scheduled Meds:    . ceFTAROline (TEFLARO) IV  600 mg Intravenous Q12H  . diltiazem  180 mg Oral Daily  . feeding supplement  1 Container Oral Q1500  . insulin aspart  0-15 Units Subcutaneous TID WC  . insulin aspart  0-5 Units Subcutaneous QHS  . insulin glargine  20 Units Subcutaneous QHS  . lisinopril  20 mg Oral Daily  . metoCLOPramide  10 mg Oral TID AC  . pantoprazole  40 mg Oral Q1200  . potassium chloride  10 mEq Oral BID  . sodium chloride      . sodium chloride      . sodium chloride      . sodium chloride      . sodium chloride      . sodium chloride      . sodium chloride      . sodium chloride      . DISCONTD: insulin glargine  16 Units Subcutaneous QHS  . DISCONTD: lisinopril  10 mg Oral Daily  . DISCONTD: metoCLOPramide (REGLAN) injection  10 mg Intravenous Q6H   Continuous Infusions:    . 0.9 % NaCl with KCl 40 mEq / L 50 mL/hr at 05/31/11 2212   PRN Meds:.acetaminophen, dextrose, hydrALAZINE, HYDROmorphone (DILAUDID) injection, promethazine, DISCONTD: acetaminophen Assessment/Plan: Principal Problem:  *DKA, type 2, resolved. Increase Lantus.  Multiple abscesses involving the gluteal cleft area, the largest of which is about 2-3 cm, now with several smaller areas draining well. I have done a minimal amount of sharp debridement of the whitish slough so that drainage is facilitated. Continue sitz baths and warm compresses twice a day. Wound care consult. Most areas have good drainage, however may need surgical debridement. Hopefully, local wound care will facilitate drainage. Cultures are growing staph, but sensitivities are pending. Continue to  flareup for now which would cover MRSA and gram-negatives. Patient may in fact have a polymicrobial infection, as she has had uncontrolled diabetes. Patient well need home health for continued wound care once stable for discharge.   Hypokalemia, continue repletion  Vomiting resolved, but still slightly nauseated. Change Reglan to by mouth. May have gastroparesis. Has documented biliary dyskinesia but never  followed up with surgeon.  Malignant hypertension: Increase lisinopril to 20 mg a day. Continue diltiazem.  Noncompliance   LOS: 3 days   Michille Mcelrath L 06/01/2011, 11:55 AM

## 2011-06-02 ENCOUNTER — Encounter (HOSPITAL_COMMUNITY): Payer: Self-pay | Admitting: Internal Medicine

## 2011-06-02 DIAGNOSIS — R000 Tachycardia, unspecified: Secondary | ICD-10-CM

## 2011-06-02 DIAGNOSIS — A4902 Methicillin resistant Staphylococcus aureus infection, unspecified site: Secondary | ICD-10-CM

## 2011-06-02 HISTORY — DX: Methicillin resistant Staphylococcus aureus infection, unspecified site: A49.02

## 2011-06-02 HISTORY — DX: Tachycardia, unspecified: R00.0

## 2011-06-02 LAB — GLUCOSE, CAPILLARY
Glucose-Capillary: 147 mg/dL — ABNORMAL HIGH (ref 70–99)
Glucose-Capillary: 91 mg/dL (ref 70–99)
Glucose-Capillary: 116 mg/dL — ABNORMAL HIGH (ref 70–99)

## 2011-06-02 LAB — BASIC METABOLIC PANEL
BUN: 3 mg/dL — ABNORMAL LOW (ref 6–23)
Chloride: 98 mEq/L (ref 96–112)
Creatinine, Ser: 0.59 mg/dL (ref 0.50–1.10)
GFR calc Af Amer: >90 mL/min (ref >90)
Glucose, Bld: 159 mg/dL — ABNORMAL HIGH (ref 70–99)

## 2011-06-02 LAB — WOUND CULTURE

## 2011-06-02 MED ORDER — SODIUM CHLORIDE 0.9 % IJ SOLN
INTRAMUSCULAR | Status: AC
  Administered 2011-06-02: 18:00:00
  Filled 2011-06-02: qty 3

## 2011-06-02 MED ORDER — POTASSIUM CHLORIDE CRYS ER 20 MEQ PO TBCR
40.0000 meq | EXTENDED_RELEASE_TABLET | Freq: Two times a day (BID) | ORAL | Status: DC
  Administered 2011-06-02 – 2011-06-04 (×5): 40 meq via ORAL
  Filled 2011-06-02 (×5): qty 2

## 2011-06-02 MED ORDER — SODIUM CHLORIDE 0.9 % IJ SOLN
INTRAMUSCULAR | Status: AC
  Administered 2011-06-02: 10 mL
  Filled 2011-06-02: qty 3

## 2011-06-02 NOTE — Consult Note (Signed)
WOC consult Note Reason for Consult:Full thickness wound at apex of buttock crease, toward right buttocks Wound type:infectious origin; suspect boil vs cyst Pressure Ulcer POA: No Measurement:4cm x 2cm x 2cm.  Undermining at 12 o'clock measuring 1.5cm. Wound NWG:NFAOZ, pink and moist.  Loose hanging piece of necrotic tissue debrided without bleeding or pain. Drainage (amount, consistency, odor) yellow, foul odor-most likely due to autolytically debriding necrotic tissue.  This should improve now that necerotic tissue has been removed. Periwound:Intact. Dressing procedure/placement/frequency:Recommend silver hydrofiber dressing with changes every M-W-F. Cover with thin film transparent dressing (Tegaderm) to better conform to the body than gauze dressings and tape Decrease sitz baths to that frequency. Conservative sharp wound debridement (CSWD performed at the bedside: Yes.  See above. When you are ready for discharge, consider HHRN for short term (i.e. three days) to teach husband wound care or have HHRN in to perform wond care 3xW until (nearly) healed.  Patient may then follow up in MD office. I will not follow, but will remain available to this patient and her medical team.  Please re-consult if needed. Thanks, Ladona Mow, MSN, RN, [facility], CWOCN [phone])

## 2011-06-02 NOTE — Progress Notes (Signed)
Lauren Steele from [employer] called to report MRSA found in wound culture.  MD notified via text.  No return call.  Schonewitz, Candelaria Stagers 06/02/2011

## 2011-06-02 NOTE — Progress Notes (Signed)
Subjective: Patient is sitting up in bed. She is eating lunch. Her nausea has subsided but has not completely resolved. She denies vomiting.  Objective: Vital signs in last 24 hours: Filed Vitals:   06/02/11 0215 06/02/11 0513 06/02/11 0607 06/02/11 1424  BP: 160/96 157/115 162/98 97/65  Pulse:  127  99  Temp:  99 F (37.2 C)  99 F (37.2 C)  TempSrc:  Oral  Oral  Resp:  20  18  Height:      Weight:      SpO2:  97%  95%    Intake/Output Summary (Last 24 hours) at 06/02/11 1524 Last data filed at 06/02/11 0606  Gross per 24 hour  Intake   1470 ml  Output   1850 ml  Net   -380 ml    Weight change:   Physical exam: Lungs: Clear to auscultation bilaterally. Heart: S1, S2, with mild tachycardia. Abdomen: Positive bowel sounds, soft, nontender, nondistended. Extremities: No pedal edema. Skin/gluteal area/sacrum: Exam deferred until wound care consult.  Lab Results: Basic Metabolic Panel:  Basename 06/02/11 0533 06/01/11 0839  NA 139 141  K 2.7* 3.1*  CL 98 104  CO2 25 23  GLUCOSE 159* 241*  BUN 3* 4*  CREATININE 0.59 0.56  CALCIUM 8.9 8.5  MG -- --  PHOS -- --   Liver Function Tests: No results found for this basename: AST:2,ALT:2,ALKPHOS:2,BILITOT:2,PROT:2,ALBUMIN:2 in the last 72 hours No results found for this basename: LIPASE:2,AMYLASE:2 in the last 72 hours No results found for this basename: AMMONIA:2 in the last 72 hours CBC:  Basename 05/31/11 0525  WBC 8.5  NEUTROABS --  HGB 12.7  HCT 36.1  MCV 85.7  PLT 481*   Cardiac Enzymes: No results found for this basename: CKTOTAL:3,CKMB:3,CKMBINDEX:3,TROPONINI:3 in the last 72 hours BNP: No results found for this basename: PROBNP:3 in the last 72 hours D-Dimer: No results found for this basename: DDIMER:2 in the last 72 hours CBG:  Basename 06/02/11 1136 06/02/11 0751 06/01/11 2025 06/01/11 1630 06/01/11 1231 06/01/11 0720  GLUCAP 94 147* 122* 153* 195* 213*   Hemoglobin A1C: No results found for  this basename: HGBA1C in the last 72 hours Fasting Lipid Panel: No results found for this basename: CHOL,HDL,LDLCALC,TRIG,CHOLHDL,LDLDIRECT in the last 72 hours Thyroid Function Tests: No results found for this basename: TSH,T4TOTAL,FREET4,T3FREE,THYROIDAB in the last 72 hours Anemia Panel: No results found for this basename: VITAMINB12,FOLATE,FERRITIN,TIBC,IRON,RETICCTPCT in the last 72 hours Coagulation: No results found for this basename: LABPROT:2,INR:2 in the last 72 hours Urine Drug Screen: Drugs of Abuse     Component Value Date/Time   LABOPIA POSITIVE* 11/10/2009 2040   COCAINSCRNUR NONE DETECTED 11/10/2009 2040   LABBENZ NONE DETECTED 11/10/2009 2040   AMPHETMU NONE DETECTED 11/10/2009 2040   THCU NONE DETECTED 11/10/2009 2040   LABBARB  Value: NONE DETECTED        DRUG SCREEN FOR MEDICAL PURPOSES ONLY.  IF CONFIRMATION IS NEEDED FOR ANY PURPOSE, NOTIFY LAB WITHIN 5 DAYS.        LOWEST DETECTABLE LIMITS FOR URINE DRUG SCREEN Drug Class       Cutoff (ng/mL) Amphetamine      1000 Barbiturate      200 Benzodiazepine   200 Tricyclics       300 Opiates          300 Cocaine          300 THC              50 11/10/2009 2040  Alcohol Level: No results found for this basename: ETH:2 in the last 72 hours Urinalysis:  Basename 05/31/11 0348 05/30/11 1713  COLORURINE YELLOW AMBER*  LABSPEC 1.025 1.025  PHURINE 6.0 6.5  GLUCOSEU 500* 500*  HGBUR LARGE* MODERATE*  BILIRUBINUR SMALL* SMALL*  KETONESUR 15* >80*  PROTEINUR 100* 100*  UROBILINOGEN 0.2 0.2  NITRITE NEGATIVE NEGATIVE  LEUKOCYTESUR NEGATIVE NEGATIVE   Misc. Labs:   Micro: Recent Results (from the past 240 hour(s))  WOUND CULTURE     Status: Normal   Collection Time   05/29/11  6:44 PM      Component Value Range Status Comment   Specimen Description WOUND   Final    Special Requests Normal   Final    Gram Stain     Final    Value: NO WBC SEEN     NO SQUAMOUS EPITHELIAL CELLS SEEN     MODERATE GRAM POSITIVE COCCI IN  PAIRS   Culture     Final    Value: MODERATE METHICILLIN RESISTANT STAPHYLOCOCCUS AUREUS     Note: RIFAMPIN AND GENTAMICIN SHOULD NOT BE USED AS SINGLE DRUGS FOR TREATMENT OF STAPH INFECTIONS. This organism DOES NOT demonstrate inducible Clindamycin resistance in vitro. CRITICAL RESULT CALLED TO, READ BACK BY AND VERIFIED WITH: Jaymes Graff      06/02/11 0953 BY SMITHERSJ   Report Status 06/02/2011 FINAL   Final    Organism ID, Bacteria METHICILLIN RESISTANT STAPHYLOCOCCUS AUREUS   Final   MRSA PCR SCREENING     Status: Abnormal   Collection Time   05/29/11  6:45 PM      Component Value Range Status Comment   MRSA by PCR INVALID RESULTS, SPECIMEN SENT FOR CULTURE (*) NEGATIVE  Final   MRSA CULTURE     Status: Normal   Collection Time   05/29/11  6:45 PM      Component Value Range Status Comment   Specimen Description NOSE   Final    Special Requests NONE   Final    Culture     Final    Value: METHICILLIN RESISTANT STAPHYLOCOCCUS AUREUS     2115 Note: CRITICAL RESULT CALLED TO, READ BACK BY AND VERIFIED WITH: NEILSON RN 06/01/11 GUS   Report Status 06/01/2011 FINAL   Final     Studies/Results: No results found.  Medications: I have reviewed the patient's current medications.  Assessment: Principal Problem:  *DKA, type 2 Active Problems:  Hypokalemia  Hematemesis  Abscess of buttock  Malignant hypertension  Noncompliance  Tachycardia  MRSA infection   1. Type 2 diabetes mellitus with resolved DKA. Noncompliance an issue. Capillary blood glucose much improved.  Multiple abscesses of the gluteal area and buttocks., secondary to MRSA. She is being treated with sitz baths, dressing changes, and antibiotic Teflaro. Wound care consult pending.  Malignant hypertension and associated chronic sinus tachycardia. She is always senna pill and diltiazem. The dose of lisinopril was recently increased.  Hypokalemia. Continue potassium chloride supplementation.  Possible diabetes  gastroparesis. Continue Reglan.  Plan:  1. Increase the dose of potassium chloride supplementation. Continue to monitor. 2. Wound care consult pending.    LOS: 4 days   Gurley Climer 06/02/2011, 3:24 PM

## 2011-06-02 NOTE — Progress Notes (Signed)
CRITICAL VALUE ALERT  Critical value received:  K+ 2.7  Date of notification:  06/02/11  Time of notification:  0701  Critical value read back: yes  Nurse who received alert:  Wyatt Mage RN  MD notified (1st page):  Dr. Sherrie Mustache  Time of first page:  [phone]  MD notified (2nd page):  Time of second page:  Responding MD:  Dr. Sherrie Mustache  Time MD responded:  [phone]

## 2011-06-03 ENCOUNTER — Inpatient Hospital Stay (HOSPITAL_COMMUNITY): Payer: BC Managed Care – PPO

## 2011-06-03 LAB — BASIC METABOLIC PANEL
BUN: 6 mg/dL (ref 6–23)
GFR calc Af Amer: >90 mL/min (ref >90)
GFR calc non Af Amer: >90 mL/min (ref >90)
Potassium: 3.5 mEq/L (ref 3.5–5.1)

## 2011-06-03 LAB — CBC
Hemoglobin: 11.4 g/dL — ABNORMAL LOW (ref 12.0–15.0)
MCHC: 33.9 g/dL (ref 30.0–36.0)
RDW: 13.6 % (ref 11.5–15.5)

## 2011-06-03 LAB — GLUCOSE, CAPILLARY

## 2011-06-03 MED ORDER — ONDANSETRON HCL 4 MG/2ML IJ SOLN
4.0000 mg | Freq: Four times a day (QID) | INTRAMUSCULAR | Status: DC
  Administered 2011-06-03 – 2011-06-04 (×5): 4 mg via INTRAVENOUS
  Filled 2011-06-03 (×5): qty 2

## 2011-06-03 MED ORDER — SODIUM CHLORIDE 0.9 % IJ SOLN
INTRAMUSCULAR | Status: AC
  Administered 2011-06-03: 10 mL
  Filled 2011-06-03: qty 3

## 2011-06-03 NOTE — Progress Notes (Signed)
Subjective: Patient is sitting up in bed. She is eating lunch. Her nausea has subsided since this morning, but has not completely resolved. She denies vomiting. She denies abdominal pain.  Objective: Vital signs in last 24 hours: Filed Vitals:   06/02/11 0607 06/02/11 1424 06/03/11 0536 06/03/11 1401  BP: 162/98 97/65 122/78 146/91  Pulse:  99 95 102  Temp:  99 F (37.2 C) 98 F (36.7 C) 98.5 F (36.9 C)  TempSrc:  Oral Oral   Resp:  18 20 20   Height:      Weight:      SpO2:  95% 95% 100%    Intake/Output Summary (Last 24 hours) at 06/03/11 1416 Last data filed at 06/03/11 1309  Gross per 24 hour  Intake 2136.67 ml  Output    700 ml  Net 1436.67 ml    Weight change:   Physical exam: Lungs: Clear to auscultation bilaterally. Heart: S1, S2, with mild tachycardia. Abdomen: Positive bowel sounds, soft, nontender, nondistended. Extremities: No pedal edema. Skin/gluteal area/sacrum: 4 cm ulcer on the right but not with some tunneling. The wound bed is pink and moist, no purulent drainage. No obvious necrotic areas (apparently debrided yesterday by the wound care nurse). There is a smaller wound that is opening and has mostly serous drainage. No current malodor.   Lab Results: Basic Metabolic Panel:  Basename 06/03/11 0554 06/02/11 0533  NA 142 139  K 3.5 2.7*  CL 103 98  CO2 27 25  GLUCOSE 71 159*  BUN 6 3*  CREATININE 0.65 0.59  CALCIUM 8.4 8.9  MG -- --  PHOS -- --   Liver Function Tests: No results found for this basename: AST:2,ALT:2,ALKPHOS:2,BILITOT:2,PROT:2,ALBUMIN:2 in the last 72 hours No results found for this basename: LIPASE:2,AMYLASE:2 in the last 72 hours No results found for this basename: AMMONIA:2 in the last 72 hours CBC:  Basename 06/03/11 0554  WBC 8.0  NEUTROABS --  HGB 11.4*  HCT 33.6*  MCV 87.0  PLT 475*   Cardiac Enzymes: No results found for this basename: CKTOTAL:3,CKMB:3,CKMBINDEX:3,TROPONINI:3 in the last 72 hours BNP: No  results found for this basename: PROBNP:3 in the last 72 hours D-Dimer: No results found for this basename: DDIMER:2 in the last 72 hours CBG:  Basename 06/03/11 1145 06/03/11 0747 06/02/11 2024 06/02/11 1625 06/02/11 1136 06/02/11 0751  GLUCAP 114* 75 116* 91 94 147*   Hemoglobin A1C: No results found for this basename: HGBA1C in the last 72 hours Fasting Lipid Panel: No results found for this basename: CHOL,HDL,LDLCALC,TRIG,CHOLHDL,LDLDIRECT in the last 72 hours Thyroid Function Tests: No results found for this basename: TSH,T4TOTAL,FREET4,T3FREE,THYROIDAB in the last 72 hours Anemia Panel: No results found for this basename: VITAMINB12,FOLATE,FERRITIN,TIBC,IRON,RETICCTPCT in the last 72 hours Coagulation: No results found for this basename: LABPROT:2,INR:2 in the last 72 hours Urine Drug Screen: Drugs of Abuse     Component Value Date/Time   LABOPIA POSITIVE* 11/10/2009 2040   COCAINSCRNUR NONE DETECTED 11/10/2009 2040   LABBENZ NONE DETECTED 11/10/2009 2040   AMPHETMU NONE DETECTED 11/10/2009 2040   THCU NONE DETECTED 11/10/2009 2040   LABBARB  Value: NONE DETECTED        DRUG SCREEN FOR MEDICAL PURPOSES ONLY.  IF CONFIRMATION IS NEEDED FOR ANY PURPOSE, NOTIFY LAB WITHIN 5 DAYS.        LOWEST DETECTABLE LIMITS FOR URINE DRUG SCREEN Drug Class       Cutoff (ng/mL) Amphetamine      1000 Barbiturate      200  Benzodiazepine   200 Tricyclics       300 Opiates          300 Cocaine          300 THC              50 11/10/2009 2040    Alcohol Level: No results found for this basename: ETH:2 in the last 72 hours Urinalysis: No results found for this basename: COLORURINE:2,APPERANCEUR:2,LABSPEC:2,PHURINE:2,GLUCOSEU:2,HGBUR:2,BILIRUBINUR:2,KETONESUR:2,PROTEINUR:2,UROBILINOGEN:2,NITRITE:2,LEUKOCYTESUR:2 in the last 72 hours Misc. Labs:   Micro: Recent Results (from the past 240 hour(s))  WOUND CULTURE     Status: Normal   Collection Time   05/29/11  6:44 PM      Component Value Range Status  Comment   Specimen Description WOUND   Final    Special Requests Normal   Final    Gram Stain     Final    Value: NO WBC SEEN     NO SQUAMOUS EPITHELIAL CELLS SEEN     MODERATE GRAM POSITIVE COCCI IN PAIRS   Culture     Final    Value: MODERATE METHICILLIN RESISTANT STAPHYLOCOCCUS AUREUS     Note: RIFAMPIN AND GENTAMICIN SHOULD NOT BE USED AS SINGLE DRUGS FOR TREATMENT OF STAPH INFECTIONS. This organism DOES NOT demonstrate inducible Clindamycin resistance in vitro. CRITICAL RESULT CALLED TO, READ BACK BY AND VERIFIED WITH: Jaymes Graff      06/02/11 0953 BY SMITHERSJ   Report Status 06/02/2011 FINAL   Final    Organism ID, Bacteria METHICILLIN RESISTANT STAPHYLOCOCCUS AUREUS   Final   MRSA PCR SCREENING     Status: Abnormal   Collection Time   05/29/11  6:45 PM      Component Value Range Status Comment   MRSA by PCR INVALID RESULTS, SPECIMEN SENT FOR CULTURE (*) NEGATIVE  Final   MRSA CULTURE     Status: Normal   Collection Time   05/29/11  6:45 PM      Component Value Range Status Comment   Specimen Description NOSE   Final    Special Requests NONE   Final    Culture     Final    Value: METHICILLIN RESISTANT STAPHYLOCOCCUS AUREUS     2115 Note: CRITICAL RESULT CALLED TO, READ BACK BY AND VERIFIED WITH: NEILSON RN 06/01/11 GUS   Report Status 06/01/2011 FINAL   Final     Studies/Results: No results found.  Medications: I have reviewed the patient's current medications.  Assessment: Principal Problem:  *DKA, type 2 Active Problems:  Hypokalemia  Hematemesis  Abscess of buttock  Malignant hypertension  Noncompliance  Tachycardia  MRSA infection   1. Type 2 diabetes mellitus with resolved DKA. Noncompliance an issue. Capillary blood glucose much improved.  Multiple abscesses of the gluteal area and and one 4 cm wound on the right buttock. She is on Teflaro. Status post sitz baths. Wound care consultation noted and appreciated. The wounds are currently being treated  with silver Hydrofiber dressing and covered with transparent dressing (Tegaderm).  Malignant hypertension and associated chronic sinus tachycardia. She is on lisinopril and diltiazem. The dose of lisinopril was recently increased.  Hypokalemia. Continue potassium chloride supplementation.  Possible diabetes gastroparesis. History of biliary dyskinesia. Acute on chronic nausea. Continue Reglan.  Plan:  1. Order ultrasound of abdomen for re-evaluation of chronic nausea and hx of biliary dyskinesia. 2. Continue wound care. 3. Probable discharge tomorrow.    LOS: 5 days   Moneka Mcquinn 06/03/2011, 2:16 PM

## 2011-06-03 NOTE — Progress Notes (Signed)
CARE MANAGEMENT NOTE 06/03/2011  Patient:  TAJA, [place]   Account Number:  [id number]  Date Initiated:  06/03/2011  Documentation initiated by:  Rosemary Holms  Subjective/Objective Assessment:   Pt admitted with DKA, DM. Pt also has gluteal abscess.     Action/Plan:   Spoke to pt at bedside. She chose [facility] for would care at home (parents used [facility])   Anticipated DC Date:  06/04/2011   Anticipated DC Plan:  HOME W HOME HEALTH SERVICES      DC Planning Services  CM consult      Choice offered to / List presented to:          [facility] arranged  HH-1 RN  HH-10 DISEASE MANAGEMENT      Status of service:  In process, will continue to follow Medicare Important Message given?   (If response is "NO", the following Medicare IM given date fields will be blank) Date Medicare IM given:   Date Additional Medicare IM given:    Discharge Disposition:    Per UR Regulation:    If discussed at Long Length of Stay Meetings, dates discussed:    Comments:  06/03/11 1645 Luv Mish RN BSN CM Advance notified of [facility] RN referral and will review dressing change orders. No other needs identifed.

## 2011-06-04 ENCOUNTER — Inpatient Hospital Stay (HOSPITAL_COMMUNITY): Payer: BC Managed Care – PPO

## 2011-06-04 MED ORDER — INSULIN GLARGINE 100 UNIT/ML ~~LOC~~ SOLN: 20.0000 [IU] | Freq: Every day | SUBCUTANEOUS | Status: DC

## 2011-06-04 MED ORDER — DOXYCYCLINE HYCLATE 100 MG PO TABS: 100.0000 mg | ORAL_TABLET | Freq: Two times a day (BID) | ORAL | Status: AC

## 2011-06-04 MED ORDER — DILTIAZEM HCL ER COATED BEADS 180 MG PO CP24: 180.0000 mg | ORAL_CAPSULE | Freq: Every day | ORAL | Status: DC

## 2011-06-04 MED ORDER — LISINOPRIL 10 MG PO TABS: 10.0000 mg | ORAL_TABLET | Freq: Every day | ORAL | Status: AC

## 2011-06-04 MED ORDER — POTASSIUM CHLORIDE CRYS ER 20 MEQ PO TBCR: 40.0000 meq | EXTENDED_RELEASE_TABLET | Freq: Two times a day (BID) | ORAL | Status: DC

## 2011-06-04 MED ORDER — METOCLOPRAMIDE HCL 10 MG PO TABS: 10.0000 mg | ORAL_TABLET | Freq: Three times a day (TID) | ORAL | Status: DC | PRN

## 2011-06-04 MED ORDER — INSULIN ASPART 100 UNIT/ML ~~LOC~~ SOLN: 0.0000 [IU] | Freq: Three times a day (TID) | SUBCUTANEOUS | Status: DC

## 2011-06-04 NOTE — Discharge Summary (Signed)
Physician Discharge Summary  Lauren Steele MRN: [id number] DOB/AGE: [id number] 43 y.o.  PCP: Lilyan Punt, MD, MD   Admit date: 05/29/2011 Discharge date: 2011/06/28  Discharge Diagnoses:  1. TYPE 2 DIABETES MELLITUS WITH DKA. 2. NONCOMPLIANCE. 3. Nausea and vomiting with transient hematemesis. 4. Small abscesses of the buttocks with one 4 cm x 2 cm x 2 cm wound, infected with MRSA. 5. History of biliary dyskinesia. Failed followup appointment in 2012. 6. Malignant Hypertension. 7. Chronic sinus tachycardia. 8. Hypokalemia.    Medication List  As of 2011-06-28  6:20 PM   TAKE these medications         diltiazem 180 MG 24 hr capsule   Commonly known as: CARDIZEM CD   Take 1 capsule (180 mg total) by mouth daily. FOR HYPERTENSION.      doxycycline 100 MG tablet   Commonly known as: VIBRA-TABS   Take 1 tablet (100 mg total) by mouth 2 (two) times daily. TAKE FOR 7 MORE DAYS.      insulin aspart 100 UNIT/ML injection   Commonly known as: novoLOG   Inject 0-15 Units into the skin 3 (three) times daily with meals. FOR BLOOD GLUCOSE BETWEEN 70-120 NO INSULIN.  BLOOD GLUCOSE 121-150 TAKE 2 UNITS.            BLOOD GLUCOSE 151-200 TAKE 3 UNITS.     BLOOD GLUCOSE 201-250 TAKE 5 UNITS.            BLOOD GLUCOSE 251-300 TAKE 8 UNITS  BLOOD GLUCOSE 301-350 TAKE 11 UNITS.           BLOOD GLUCOSE 351-400 TAKE 15 UNITS.      insulin glargine 100 UNIT/ML injection   Commonly known as: LANTUS   Inject 20 Units into the skin at bedtime.      lisinopril 10 MG tablet   Commonly known as: PRINIVIL,ZESTRIL   Take 1 tablet (10 mg total) by mouth daily. FOR HYPERTENSION.      metoCLOPramide 10 MG tablet   Commonly known as: REGLAN   Take 1 tablet (10 mg total) by mouth 3 (three) times daily with meals as needed (NAUSEA).      omeprazole 20 MG capsule   Commonly known as: PRILOSEC   Take 20 mg by mouth daily.      potassium chloride SA 20 MEQ tablet   Commonly known as:  K-DUR,KLOR-CON   Take 2 tablets (40 mEq total) by mouth 2 (two) times daily.            Discharge Condition: Improved and stable.  Disposition: 01-Home or Self Care   Consults: Wound care facilitator   Significant Diagnostic Studies: US Abdomen Limited Ruq  06/28/2011  *RADIOLOGY REPORT*  Clinical Data:  Nausea vomiting.  LIMITED ABDOMINAL ULTRASOUND - RIGHT UPPER QUADRANT  Comparison:  03/21/2010  Findings:  Gallbladder:  Dependent sludge is seen layering in the gallbladder lumen.  No gallstones.  No gallbladder wall thickening or pericholecystic fluid.  The sonographer reports no sonographic Murphy's sign.  Common bile duct:  Upper limits of normal diameter at 5 mm.  Liver:  Normal sonographic appearance.  IMPRESSION: Gallbladder sludge without stones or gallbladder wall thickening. No biliary dilatation.                   Original Report Authenticated By: ERIC A. MANSELL, M.D.     Microbiology: Recent Results (from the past 240 hour(s))  WOUND CULTURE     Status: Normal  Collection Time   05/29/11  6:44 PM      Component Value Range Status Comment   Specimen Description WOUND   Final    Special Requests Normal   Final    Gram Stain     Final    Value: NO WBC SEEN     NO SQUAMOUS EPITHELIAL CELLS SEEN     MODERATE GRAM POSITIVE COCCI IN PAIRS   Culture     Final    Value: MODERATE METHICILLIN RESISTANT STAPHYLOCOCCUS AUREUS     Note: RIFAMPIN AND GENTAMICIN SHOULD NOT BE USED AS SINGLE DRUGS FOR TREATMENT OF STAPH INFECTIONS. This organism DOES NOT demonstrate inducible Clindamycin resistance in vitro. CRITICAL RESULT CALLED TO, READ BACK BY AND VERIFIED WITH: Jaymes Graff      06/02/11 0953 BY SMITHERSJ   Report Status 06/02/2011 FINAL   Final    Organism ID, Bacteria METHICILLIN RESISTANT STAPHYLOCOCCUS AUREUS   Final   MRSA PCR SCREENING     Status: Abnormal   Collection Time   05/29/11  6:45 PM      Component Value Range Status Comment   MRSA by PCR INVALID  RESULTS, SPECIMEN SENT FOR CULTURE (*) NEGATIVE  Final   MRSA CULTURE     Status: Normal   Collection Time   05/29/11  6:45 PM      Component Value Range Status Comment   Specimen Description NOSE   Final    Special Requests NONE   Final    Culture     Final    Value: METHICILLIN RESISTANT STAPHYLOCOCCUS AUREUS     2115 Note: CRITICAL RESULT CALLED TO, READ BACK BY AND VERIFIED WITH: NEILSON RN 06/01/11 GUS   Report Status 06/01/2011 FINAL   Final      Labs: Results for orders placed during the hospital encounter of 05/29/11 (from the past 48 hour(s))  GLUCOSE, CAPILLARY     Status: Abnormal   Collection Time   06/02/11  8:24 PM      Component Value Range Comment   Glucose-Capillary 116 (*) 70 - 99 (mg/dL)    Comment 1 Notify RN      Comment 2 Documented in Chart     BASIC METABOLIC PANEL     Status: Normal   Collection Time   06/03/11  5:54 AM      Component Value Range Comment   Sodium 142  135 - 145 (mEq/L)    Potassium 3.5  3.5 - 5.1 (mEq/L) DELTA CHECK NOTED   Chloride 103  96 - 112 (mEq/L)    CO2 27  19 - 32 (mEq/L)    Glucose, Bld 71  70 - 99 (mg/dL)    BUN 6  6 - 23 (mg/dL)    Creatinine, Ser 0.98  0.50 - 1.10 (mg/dL)    Calcium 8.4  8.4 - 10.5 (mg/dL)    GFR calc non Af Amer >90  >90 (mL/min)    GFR calc Af Amer >90  >90 (mL/min)   CBC     Status: Abnormal   Collection Time   06/03/11  5:54 AM      Component Value Range Comment   WBC 8.0  4.0 - 10.5 (K/uL)    RBC 3.86 (*) 3.87 - 5.11 (MIL/uL)    Hemoglobin 11.4 (*) 12.0 - 15.0 (g/dL)    HCT 11.9 (*) 14.7 - 46.0 (%)    MCV 87.0  78.0 - 100.0 (fL)    MCH 29.5  26.0 - 34.0 (  pg)    MCHC 33.9  30.0 - 36.0 (g/dL)    RDW 16.1  09.6 - 04.5 (%)    Platelets 475 (*) 150 - 400 (K/uL)   GLUCOSE, CAPILLARY     Status: Normal   Collection Time   06/03/11  7:47 AM      Component Value Range Comment   Glucose-Capillary 75  70 - 99 (mg/dL)    Comment 1 Notify RN     GLUCOSE, CAPILLARY     Status: Abnormal   Collection Time     06/03/11 11:45 AM      Component Value Range Comment   Glucose-Capillary 114 (*) 70 - 99 (mg/dL)    Comment 1 Notify RN     GLUCOSE, CAPILLARY     Status: Abnormal   Collection Time   06/03/11  5:08 PM      Component Value Range Comment   Glucose-Capillary 68 (*) 70 - 99 (mg/dL)    Comment 1 Notify RN      Comment 2 Documented in Chart     GLUCOSE, CAPILLARY     Status: Abnormal   Collection Time   06/03/11  8:50 PM      Component Value Range Comment   Glucose-Capillary 183 (*) 70 - 99 (mg/dL)    Comment 1 Notify RN      Comment 2 Documented in Chart     GLUCOSE, CAPILLARY     Status: Normal   Collection Time   06/04/11  7:28 AM      Component Value Range Comment   Glucose-Capillary 76  70 - 99 (mg/dL)    Comment 1 Notify RN     GLUCOSE, CAPILLARY     Status: Normal   Collection Time   06/04/11 11:14 AM      Component Value Range Comment   Glucose-Capillary 75  70 - 99 (mg/dL)    Comment 1 Notify RN        HPI : The patient is a 41 year old woman with a past medical history significant for type 2 diabetes mellitus, hypertension, and biliary dyskinesia who presented to the emergency department on May 29 2011 with a chief complaint of nausea and vomiting. She also had one blood-tinged emesis. She admittedly stopped taking insulin and her antihypertensive medications because she had been feeling better and she had been exercising and eating better. In the emergency department, she was noted to be hypertensive, afebrile, and otherwise hemodynamically stable. Her lab data were significant for a CO2 of 12, glucose of 477, small blood acetone level, WBC of 13.3, potassium of 2.6,, normal lipase, normal LFTs, and an ABG that revealed a pH of 7.3/PCO2 of 23/PO2 of 97.5. She was admitted for further evaluation and management.  HOSPITAL COURSE:  The patient was started on IV fluid hydration. Potassium was repleted in the IV fluids. The Glucommander insulin drip protocol was started. IV  Protonix was given and as needed Zofran was ordered for nausea and vomiting. Following resolution of her DKA, her nausea and vomiting subsided but did not completely resolve until the day of discharge. She was started on Reglan for possible diabetic gastroparesis. An ultrasound of her abdomen was ordered. It revealed gallbladder sludge but no gallstones or biliary dilatation. She has a history of biliary dyskinesia dating back to 2012, however, she was lost to followup. Once again, an outpatient appointment was made for her to followup with general surgeon Dr. Leticia Penna for further evaluation.   Following the discontinuation of  the IV insulin drip, Lantus and sliding scale NovoLog were started. Her capillary blood glucose for the most part improved and remained stable on this regimen during the last few days of the hospitalization.  Her blood pressure increased progressively. She also was chronically tachycardic. She was started on lisinopril and diltiazem. Her blood pressures and heart rate improved but did not completely normalized. Hopefully, over time, these medications will be more effective. Further management will be deferred to her primary care physician.  She remained hypokalemic for several days. Her blood magnesium level was within normal limits. She was continued on potassium chloride supplementation following hospital discharge.  The patient was noted to have draining multiple abscess ease in the gluteal cleft area. There was one larger open wound and several smaller non-opened wounds. She was started on Teflaro empirically. A specimen from one of abscesses was sent for culturing. It grew out MRSA. Sitz baths were started twice a day. The wound care facilitator was consulted. During her assessment, she noted that there was an open wound measuring 4 cm. The wound bed was clean and pink but there was a loose hanging piece of necrotic tissue that was debrided without bleeding or pain. She recommended  applying silver Hydrofiber dressing to be changed every Monday, Wednesday, and Friday. It should be covered with Tegaderm.  The patient improved clinically and symptomatically. She remained afebrile. Her white blood cell count normalized. Her nausea and vomiting resolved. She was strongly advised to take her medications and insulin as prescribed at the time of discharge. Home health services were ordered for assistance with wound care and dressing changes. She was discharged on doxycycline after receiving IV antibiotic therapy during the majority of the hospitalization.    Discharge Exam: Blood pressure 128/86, pulse 87, temperature 98.2 F (36.8 C), temperature source Oral, resp. rate 18, height 5\' 8"  (1.727 m), weight 78.9 kg (173 lb 15.1 oz), last menstrual period 05/23/2011, SpO2 95.00%.   lungs: Clear to auscultation bilaterally. Heart: S1, S2, with no murmurs rubs gallops. Abdomen: Positive bowel sounds, soft, nontender, nondistended. Extremities: No pedal edema. Skin/sacrum: Unchanged from yesterday's exam.    Discharge Orders    Future Orders Please Complete By Expires   Diet - low sodium heart healthy      Diet Carb Modified      Increase activity slowly      Discharge wound care:      Comments:   APPLY SILVER HYDROFIBER DRESSING WITH CHANGES EVERY MONDAY- WEDNESDAY- FRIDAY. COVER WITH THIN FILM TRANSPARENT DRESSING (TEGADERM). AT THE TIME OF DRESSING CHANGE, GENTLY CLEAN THE WOUND WITH MILD SOAPY WATER OR SALINE AND DAB  DRY.      Follow-up Information    Follow up with Fabio Bering, MD on 06/17/2011. (AT 9:30 AM FOR SURGICAL CONSULTATION.)    Contact information:   [address] [place] [address] [phone]       Follow up with Lilyan Punt, MD on 06/12/2011. (at 10:00 am)    Contact information:   [address] [place] [address] [phone]          Total discharge time: 45  minutes.   Signed: Jaquel Glassburn 06/04/2011, 6:20 PM

## 2011-06-04 NOTE — Progress Notes (Signed)
D/c instructions reviewed with patient and husband.  Verbalized understanding. Pt dc'd to home with husband.  Schonewitz, Candelaria Stagers 06/04/2011

## 2011-08-08 ENCOUNTER — Emergency Department (HOSPITAL_COMMUNITY): Payer: BC Managed Care – PPO

## 2011-08-08 ENCOUNTER — Inpatient Hospital Stay (HOSPITAL_COMMUNITY)
Admission: EM | Admit: 2011-08-08 | Discharge: 2011-08-13 | DRG: 127 | Disposition: A | Discharge status: Home / Self Care | Payer: BC Managed Care – PPO | Attending: Internal Medicine | Admitting: Internal Medicine

## 2011-08-08 ENCOUNTER — Encounter (HOSPITAL_COMMUNITY): Payer: Self-pay | Admitting: *Deleted

## 2011-08-08 DIAGNOSIS — E118 Type 2 diabetes mellitus with unspecified complications: Secondary | ICD-10-CM

## 2011-08-08 DIAGNOSIS — E782 Mixed hyperlipidemia: Secondary | ICD-10-CM

## 2011-08-08 DIAGNOSIS — I5031 Acute diastolic (congestive) heart failure: Secondary | ICD-10-CM

## 2011-08-08 DIAGNOSIS — Z8614 Personal history of Methicillin resistant Staphylococcus aureus infection: Secondary | ICD-10-CM

## 2011-08-08 DIAGNOSIS — I429 Cardiomyopathy, unspecified: Secondary | ICD-10-CM | POA: Diagnosis present

## 2011-08-08 DIAGNOSIS — I10 Essential (primary) hypertension: Secondary | ICD-10-CM

## 2011-08-08 DIAGNOSIS — I498 Other specified cardiac arrhythmias: Secondary | ICD-10-CM | POA: Diagnosis present

## 2011-08-08 DIAGNOSIS — Z87891 Personal history of nicotine dependence: Secondary | ICD-10-CM

## 2011-08-08 DIAGNOSIS — N83209 Unspecified ovarian cyst, unspecified side: Secondary | ICD-10-CM | POA: Diagnosis present

## 2011-08-08 DIAGNOSIS — R946 Abnormal results of thyroid function studies: Secondary | ICD-10-CM | POA: Diagnosis present

## 2011-08-08 DIAGNOSIS — N390 Urinary tract infection, site not specified: Secondary | ICD-10-CM | POA: Diagnosis present

## 2011-08-08 DIAGNOSIS — E876 Hypokalemia: Secondary | ICD-10-CM | POA: Diagnosis present

## 2011-08-08 DIAGNOSIS — I428 Other cardiomyopathies: Secondary | ICD-10-CM | POA: Diagnosis present

## 2011-08-08 DIAGNOSIS — K828 Other specified diseases of gallbladder: Secondary | ICD-10-CM | POA: Diagnosis present

## 2011-08-08 DIAGNOSIS — R109 Unspecified abdominal pain: Secondary | ICD-10-CM | POA: Diagnosis present

## 2011-08-08 DIAGNOSIS — E1165 Type 2 diabetes mellitus with hyperglycemia: Secondary | ICD-10-CM

## 2011-08-08 DIAGNOSIS — Z794 Long term (current) use of insulin: Secondary | ICD-10-CM

## 2011-08-08 DIAGNOSIS — R112 Nausea with vomiting, unspecified: Secondary | ICD-10-CM | POA: Diagnosis present

## 2011-08-08 DIAGNOSIS — R52 Pain, unspecified: Secondary | ICD-10-CM

## 2011-08-08 DIAGNOSIS — N289 Disorder of kidney and ureter, unspecified: Secondary | ICD-10-CM | POA: Diagnosis present

## 2011-08-08 DIAGNOSIS — I5021 Acute systolic (congestive) heart failure: Principal | ICD-10-CM | POA: Diagnosis present

## 2011-08-08 DIAGNOSIS — IMO0002 Reserved for concepts with insufficient information to code with codable children: Secondary | ICD-10-CM

## 2011-08-08 DIAGNOSIS — IMO0001 Reserved for inherently not codable concepts without codable children: Secondary | ICD-10-CM | POA: Diagnosis present

## 2011-08-08 DIAGNOSIS — I509 Heart failure, unspecified: Secondary | ICD-10-CM | POA: Diagnosis present

## 2011-08-08 HISTORY — DX: Acute systolic (congestive) heart failure: I50.21

## 2011-08-08 HISTORY — DX: Other ill-defined heart diseases: I51.89

## 2011-08-08 HISTORY — DX: Abnormal results of thyroid function studies: R94.6

## 2011-08-08 HISTORY — DX: Other specified diseases of gallbladder: K82.8

## 2011-08-08 LAB — DIFFERENTIAL
Basophils Absolute: 0 10*3/uL (ref 0.0–0.1)
Eosinophils Absolute: 0 10*3/uL (ref 0.0–0.7)
Eosinophils Relative: 0 % (ref 0–5)
Monocytes Absolute: 0.3 10*3/uL (ref 0.1–1.0)

## 2011-08-08 LAB — COMPREHENSIVE METABOLIC PANEL
ALT: 46 U/L — ABNORMAL HIGH (ref 0–35)
ALT: 44 U/L — ABNORMAL HIGH (ref 0–35)
AST: 24 U/L (ref 0–37)
AST: 20 U/L (ref 0–37)
Alkaline Phosphatase: 98 U/L (ref 39–117)
CO2: 22 mEq/L (ref 19–32)
Calcium: 10.3 mg/dL (ref 8.4–10.5)
Calcium: 9.8 mg/dL (ref 8.4–10.5)
Creatinine, Ser: 0.66 mg/dL (ref 0.50–1.10)
GFR calc Af Amer: >90 mL/min (ref >90)
Glucose, Bld: 245 mg/dL — ABNORMAL HIGH (ref 70–99)
Potassium: 3.4 mEq/L — ABNORMAL LOW (ref 3.5–5.1)
Sodium: 141 mEq/L (ref 135–145)
Sodium: 139 mEq/L (ref 135–145)
Total Protein: 8.2 g/dL (ref 6.0–8.3)
Total Protein: 7.8 g/dL (ref 6.0–8.3)

## 2011-08-08 LAB — PREGNANCY, URINE: Preg Test, Ur: NEGATIVE

## 2011-08-08 LAB — CBC
HCT: 41.1 % (ref 36.0–46.0)
MCH: 28.2 pg (ref 26.0–34.0)
MCHC: 32.4 g/dL (ref 30.0–36.0)
MCV: 87.1 fL (ref 78.0–100.0)
Platelets: 458 10*3/uL — ABNORMAL HIGH (ref 150–400)
RDW: 14.1 % (ref 11.5–15.5)

## 2011-08-08 LAB — PRO B NATRIURETIC PEPTIDE: Pro B Natriuretic peptide (BNP): 5480 pg/mL — ABNORMAL HIGH (ref 0–125)

## 2011-08-08 LAB — URINALYSIS, ROUTINE W REFLEX MICROSCOPIC
Glucose, UA: 250 mg/dL — AB
Protein, ur: >300 mg/dL — AB
Specific Gravity, Urine: 1.025 (ref 1.005–1.030)
Urobilinogen, UA: 1 mg/dL (ref 0.0–1.0)

## 2011-08-08 LAB — CARDIAC PANEL(CRET KIN+CKTOT+MB+TROPI): Relative Index: 1.3 (ref 0.0–2.5)

## 2011-08-08 LAB — PROTIME-INR: Prothrombin Time: 13.8 seconds (ref 11.6–15.2)

## 2011-08-08 LAB — URINE MICROSCOPIC-ADD ON

## 2011-08-08 LAB — GLUCOSE, CAPILLARY
Glucose-Capillary: 189 mg/dL — ABNORMAL HIGH (ref 70–99)
Glucose-Capillary: 210 mg/dL — ABNORMAL HIGH (ref 70–99)

## 2011-08-08 LAB — APTT: aPTT: 25 seconds (ref 24–37)

## 2011-08-08 MED ORDER — SODIUM CHLORIDE 0.9 % IJ SOLN
3.0000 mL | INTRAMUSCULAR | Status: DC | PRN
  Administered 2011-08-12 (×2): 3 mL via INTRAVENOUS
  Filled 2011-08-08 (×3): qty 3

## 2011-08-08 MED ORDER — ONDANSETRON HCL 4 MG/2ML IJ SOLN
4.0000 mg | Freq: Four times a day (QID) | INTRAMUSCULAR | Status: DC | PRN
  Administered 2011-08-08 – 2011-08-09 (×2): 4 mg via INTRAVENOUS
  Filled 2011-08-08 (×2): qty 2

## 2011-08-08 MED ORDER — ONDANSETRON HCL 4 MG/2ML IJ SOLN: 4.0000 mg | Freq: Three times a day (TID) | INTRAMUSCULAR | Status: DC | PRN

## 2011-08-08 MED ORDER — INSULIN GLARGINE 100 UNIT/ML ~~LOC~~ SOLN
20.0000 [IU] | Freq: Every day | SUBCUTANEOUS | Status: DC
  Administered 2011-08-08 – 2011-08-11 (×4): 20 [IU] via SUBCUTANEOUS

## 2011-08-08 MED ORDER — ONDANSETRON HCL 4 MG/2ML IJ SOLN
4.0000 mg | Freq: Once | INTRAMUSCULAR | Status: AC
  Administered 2011-08-08: 4 mg via INTRAVENOUS
  Filled 2011-08-08: qty 2

## 2011-08-08 MED ORDER — SODIUM CHLORIDE 0.9 % IV SOLN: 250.0000 mL | INTRAVENOUS | Status: DC | PRN

## 2011-08-08 MED ORDER — MORPHINE SULFATE 4 MG/ML IJ SOLN
4.0000 mg | Freq: Once | INTRAMUSCULAR | Status: AC
  Administered 2011-08-08: 4 mg via INTRAVENOUS
  Filled 2011-08-08: qty 1

## 2011-08-08 MED ORDER — PANTOPRAZOLE SODIUM 40 MG PO TBEC
40.0000 mg | DELAYED_RELEASE_TABLET | Freq: Every day | ORAL | Status: DC
  Administered 2011-08-09 – 2011-08-13 (×5): 40 mg via ORAL
  Filled 2011-08-08 (×5): qty 1

## 2011-08-08 MED ORDER — METOCLOPRAMIDE HCL 5 MG/ML IJ SOLN
10.0000 mg | Freq: Once | INTRAMUSCULAR | Status: AC
  Administered 2011-08-08: 10 mg via INTRAVENOUS
  Filled 2011-08-08: qty 2

## 2011-08-08 MED ORDER — ACETAMINOPHEN 325 MG PO TABS: 650.0000 mg | ORAL_TABLET | ORAL | Status: DC | PRN

## 2011-08-08 MED ORDER — LISINOPRIL 10 MG PO TABS
10.0000 mg | ORAL_TABLET | Freq: Every day | ORAL | Status: DC
  Administered 2011-08-09 – 2011-08-10 (×2): 10 mg via ORAL
  Filled 2011-08-08 (×2): qty 1

## 2011-08-08 MED ORDER — POTASSIUM CHLORIDE CRYS ER 20 MEQ PO TBCR
40.0000 meq | EXTENDED_RELEASE_TABLET | Freq: Once | ORAL | Status: AC
  Administered 2011-08-08: 40 meq via ORAL
  Filled 2011-08-08: qty 2

## 2011-08-08 MED ORDER — DEXTROSE 5 % IV SOLN
1.0000 g | INTRAVENOUS | Status: DC
  Administered 2011-08-09 – 2011-08-13 (×5): 1 g via INTRAVENOUS
  Filled 2011-08-08 (×5): qty 10

## 2011-08-08 MED ORDER — ASPIRIN EC 81 MG PO TBEC
81.0000 mg | DELAYED_RELEASE_TABLET | Freq: Every day | ORAL | Status: DC
  Administered 2011-08-08 – 2011-08-13 (×6): 81 mg via ORAL
  Filled 2011-08-08 (×6): qty 1

## 2011-08-08 MED ORDER — FUROSEMIDE 10 MG/ML IJ SOLN
40.0000 mg | Freq: Once | INTRAMUSCULAR | Status: AC
  Administered 2011-08-08: 40 mg via INTRAVENOUS
  Filled 2011-08-08: qty 4

## 2011-08-08 MED ORDER — OXYCODONE-ACETAMINOPHEN 5-325 MG PO TABS
1.0000 | ORAL_TABLET | Freq: Three times a day (TID) | ORAL | Status: DC | PRN
  Administered 2011-08-08 – 2011-08-09 (×2): 1 via ORAL
  Filled 2011-08-08 (×2): qty 1

## 2011-08-08 MED ORDER — INSULIN ASPART 100 UNIT/ML ~~LOC~~ SOLN: 0.0000 [IU] | Freq: Every day | SUBCUTANEOUS | Status: DC

## 2011-08-08 MED ORDER — DEXTROSE 5 % IV SOLN
1.0000 g | Freq: Once | INTRAVENOUS | Status: AC
  Administered 2011-08-08: 1 g via INTRAVENOUS
  Filled 2011-08-08: qty 10

## 2011-08-08 MED ORDER — FUROSEMIDE 10 MG/ML IJ SOLN: 40.0000 mg | Freq: Two times a day (BID) | INTRAMUSCULAR | Status: DC

## 2011-08-08 MED ORDER — SODIUM CHLORIDE 0.9 % IJ SOLN
3.0000 mL | Freq: Two times a day (BID) | INTRAMUSCULAR | Status: DC
  Administered 2011-08-08 – 2011-08-13 (×8): 3 mL via INTRAVENOUS
  Filled 2011-08-08 (×3): qty 3

## 2011-08-08 MED ORDER — ENOXAPARIN SODIUM 30 MG/0.3ML ~~LOC~~ SOLN
30.0000 mg | SUBCUTANEOUS | Status: DC
  Administered 2011-08-08: 30 mg via SUBCUTANEOUS
  Filled 2011-08-08: qty 0.3

## 2011-08-08 MED ORDER — METOPROLOL TARTRATE 25 MG PO TABS
25.0000 mg | ORAL_TABLET | Freq: Two times a day (BID) | ORAL | Status: DC
  Administered 2011-08-08 – 2011-08-10 (×4): 25 mg via ORAL
  Filled 2011-08-08 (×4): qty 1

## 2011-08-08 MED ORDER — FUROSEMIDE 10 MG/ML IJ SOLN
40.0000 mg | Freq: Every day | INTRAMUSCULAR | Status: DC
  Administered 2011-08-08 – 2011-08-09 (×2): 40 mg via INTRAVENOUS
  Filled 2011-08-08 (×2): qty 4

## 2011-08-08 MED ORDER — INSULIN ASPART 100 UNIT/ML ~~LOC~~ SOLN
0.0000 [IU] | Freq: Three times a day (TID) | SUBCUTANEOUS | Status: DC
  Administered 2011-08-09 (×2): 2 [IU] via SUBCUTANEOUS
  Administered 2011-08-09: 3 [IU] via SUBCUTANEOUS
  Administered 2011-08-10: 5 [IU] via SUBCUTANEOUS
  Administered 2011-08-10 (×2): 3 [IU] via SUBCUTANEOUS
  Administered 2011-08-11 (×2): 5 [IU] via SUBCUTANEOUS
  Administered 2011-08-12 – 2011-08-13 (×3): 3 [IU] via SUBCUTANEOUS

## 2011-08-08 MED ORDER — SODIUM CHLORIDE 0.9 % IV BOLUS (SEPSIS)
1000.0000 mL | Freq: Once | INTRAVENOUS | Status: AC
  Administered 2011-08-08: 1000 mL via INTRAVENOUS

## 2011-08-08 NOTE — ED Notes (Signed)
Generalized abd pain with n/v x 2 days.  Denies diarrhea.

## 2011-08-08 NOTE — ED Provider Notes (Addendum)
History     CSN: [id number]  Arrival date & time 08/08/11  1300   First MD Initiated Contact with Patient 08/08/11 1350      Chief Complaint  Patient presents with  . Abdominal Pain  . n/v     (Consider location/radiation/quality/duration/timing/severity/associated sxs/prior treatment) HPI....diffuse abdominal pain since Wednesday with associated nausea, described as achy.  Pain is moderate in intensity. No radiation. Nothing makes it better or worse. No previous abdominal surgery.  Past Medical History  Diagnosis Date  . Hypertension   . Diabetes mellitus   . 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 Noncompliance   . MRSA infection 06/02/2011    History reviewed. No pertinent past surgical history.  No family history on file.  History  Substance Use Topics  . Smoking status: Former Games developer  . Smokeless tobacco: Not on file  . Alcohol Use: No    OB History    Grav Para Term Preterm Abortions TAB SAB Ect Mult Living                  Review of Systems  All other systems reviewed and are negative.    Allergies  Review of patient's allergies indicates no known allergies.  Home Medications   Current Outpatient Rx  Name Route Sig Dispense Refill  . HYDROCHLOROTHIAZIDE 25 MG PO TABS Oral Take 25 mg by mouth daily.    . INSULIN ASPART 100 UNIT/ML [place] SOLN Subcutaneous Inject 0-15 Units into the skin 3 (three) times daily with meals. FOR BLOOD GLUCOSE BETWEEN 70-120 NO INSULIN. BLOOD GLUCOSE 121-150 TAKE 2 UNITS.            BLOOD GLUCOSE 151-200 TAKE 3 UNITS.    BLOOD GLUCOSE 201-250 TAKE 5 UNITS.            BLOOD GLUCOSE 251-300 TAKE 8 UNITS BLOOD GLUCOSE 301-350 TAKE 11 UNITS.           BLOOD GLUCOSE 351-400 TAKE 15 UNITS. 1 vial 2  . INSULIN GLARGINE 100 UNIT/ML [place] SOLN Subcutaneous Inject 20 Units into the skin at bedtime. 10 mL 2  . LISINOPRIL 10 MG PO TABS Oral Take 1 tablet (10 mg total) by mouth daily. FOR HYPERTENSION. 30 tablet 2  . OMEPRAZOLE 20 MG PO CPDR  Oral Take 20 mg by mouth daily.      BP 186/126  Pulse 115  Temp(Src) 98.9 F (37.2 C) (Oral)  Resp 16  Ht 5\' 7"  (1.702 m)  Wt 180 lb (81.647 kg)  BMI 28.19 kg/m2  SpO2 100%  LMP 07/14/2011  Physical Exam  Nursing note and vitals reviewed. Constitutional: She is oriented to person, place, and time. She appears well-developed and well-nourished.  HENT:  Head: Normocephalic and atraumatic.  Eyes: Conjunctivae and EOM are normal. Pupils are equal, round, and reactive to light.  Neck: Normal range of motion. Neck supple.  Cardiovascular: Normal rate and regular rhythm.   Pulmonary/Chest: Effort normal and breath sounds normal.  Abdominal: Bowel sounds are normal.       Minimal diffuse abdominal tenderness  Musculoskeletal: Normal range of motion.  Neurological: She is alert and oriented to person, place, and time.  Skin: Skin is warm and dry.  Psychiatric: She has a normal mood and affect.    ED Course  Procedures (including critical care time)  Labs Reviewed  CBC - Abnormal; Notable for the following:    Platelets 458 (*)    All other components within normal limits  DIFFERENTIAL - Abnormal; Notable for the following:    Neutrophils Relative 89 (*)    Neutro Abs 8.0 (*)    Lymphocytes Relative 8 (*)    All other components within normal limits  COMPREHENSIVE METABOLIC PANEL  LIPASE, BLOOD  URINALYSIS, ROUTINE W REFLEX MICROSCOPIC  PREGNANCY, URINE   No results found.   No diagnosis found.    MDM  No obvious point tenderness. IV hydration, pain management, three-way abdomen, labs.   Recheck at 1600: Patient still complaining of diffuse abdominal pain. Will treat urinary tract infection with IV Rocephin. CT of abdomen and pelvis ordered. Discussed with Dr. Brooke Dare.     Donnetta Hutching, MD 08/08/11 1423  Donnetta Hutching, MD 08/08/11 [phone]

## 2011-08-08 NOTE — H&P (Signed)
PCP:  Lilyan Punt, MD, MD   DOA:  08/08/2011  1:11 PM  Chief Complaint:  Abdominal pain  HPI: 41 year old female with history of HTN., DM who presented to ED with complaints of worsening abdominal pain over past few days prior to admission. Patient reports having diffuse abdominal pain, constant, 7/10 in intensity, non radiating, associated with nausea and vomiting. Patient has had fewer episodes of vomiting but no blood in it. Patient reports fever and chills at home. NO blood in stool or urine reported, no diarrhea or constipation, no lightheadedness, no dizziness and no loss of consciousness.  Assessment/Plan  Principal Problem:   *Acute exacerbation of CHF (congestive heart failure) - mild CHF based on x ray findings but BNP elevated at 5,480 - 1 dose of lasix 40 mg IV given in ED and we will continue 40 mg daily IV; unable to appreciate bibasilar crackles on physical exam - started low dose beta blocker - continue aspirin - daily weight - strict intake and output - replete electrolytes;  - check fasting lipid panel - telemetry monitoring - cycle cardiac enzymes - follow up admission 2 D ECHO  Active Problems:   Abdominal pain - associated with nausea and vomiting - secondary to UTI - we will provide supportive acre with antiemetics and analgesia - started IV ceftriaxone - stop IV fluids - encourage PO intake as patient does not report vomiting at this time - please follow up results of CT abdomen/pelvis  Hypokalemia - secondary to GI losses, nausea and vomiting - repleted in ED - follow up BMP in am   Type II diabetes mellitus with complication, uncontrolled - continue insulin 20 units at bedtime - sliding scale insulin - continue CBG monitoring  - check A1c   HTN (hypertension) - BP 182/112 on admission - please continue home medication regimen with lisinopril 10 mg daily but this may be increased for better BO control - added low dose beta blocker for CHF  which may improve BP as well - we will add lasix 40 mg daily   UTI (lower urinary tract infection) - started ceftriaxone 1 gm  Q 24 hours IV - follow up urine culture results  DVT Prophylaxis - lovenox sub Q  Code Status - full code  Diet - heart healthy  Disposition - admit to telemetry  Education  - test results and diagnostic studies were discussed with patient  at the bedside - patient  verbalized the understanding - questions were answered at the bedside and contact information was provided for additional questions or concerns  Antibiotics 1. Ceftriaxone 08/08/2011 -->  Allergies: No Known Allergies  Prior to Admission medications   Medication Sig Start Date End Date Taking? Authorizing Provider  hydrochlorothiazide (HYDRODIURIL) 25 MG tablet Take 25 mg by mouth daily.   Yes Historical Provider, MD  insulin aspart (NOVOLOG) 100 UNIT/ML injection Inject 0-15 Units into the skin 3 (three) times daily with [facility] 06/04/11 06/03/12 Yes Elliot Cousin, MD  insulin glargine (LANTUS) 100 UNIT/ML injection Inject 20 Units into the skin at bedtime. 06/04/11 06/03/12 Yes Elliot Cousin, MD  lisinopril (PRINIVIL,ZESTRIL) 10 MG tablet Take 1 tablet (10 mg total) by mouth daily. FOR HYPERTENSION. 06/04/11 06/03/12 Yes Elliot Cousin, MD  omeprazole (PRILOSEC) 20 MG capsule Take 20 mg by mouth daily.   Yes Historical Provider, MD    Past Medical History  Diagnosis Date  . Hypertension   . Diabetes mellitus   . 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  Noncompliance   . MRSA infection 06/02/2011    History reviewed. No pertinent past surgical history.  Social History:  reports that she has quit smoking. She does not have any smokeless tobacco history on file. She reports that she does not drink alcohol or use illicit drugs.  No family history on file.  Review of Systems:  Constitutional: Denies fever, chills, diaphoresis, appetite change and fatigue.  HEENT: Denies photophobia, eye  pain, redness, hearing loss, ear pain, congestion, sore throat, rhinorrhea, sneezing, mouth sores, trouble swallowing, neck pain, neck stiffness and tinnitus.   Respiratory: Denies SOB, DOE, cough, chest tightness,  and wheezing.   Cardiovascular: per HPI.  Gastrointestinal: Denies nausea, vomiting, abdominal pain, diarrhea, constipation, blood in stool and abdominal distention.  Genitourinary: Denies dysuria, urgency, frequency, hematuria, flank pain and difficulty urinating.  Musculoskeletal: Denies myalgias, back pain, joint swelling, arthralgias and gait problem.  Skin: Denies pallor, rash and wound.  Neurological: Denies dizziness, seizures, syncope, weakness, light-headedness, numbness and headaches.  Hematological: Denies adenopathy. Easy bruising, personal or family bleeding history  Psychiatric/Behavioral: Denies suicidal ideation, mood changes, confusion, nervousness, sleep disturbance and agitation   Physical Exam:  Filed Vitals:   08/08/11 1313 08/08/11 1719 08/08/11 1722 08/08/11 2100  BP: 186/126 182/112  181/118  Pulse: 115  108 108  Temp: 98.9 F (37.2 C)   98.5 F (36.9 C)  TempSrc: Oral   Oral  Resp: 16   16  Height: 5\' 7"  (1.702 m)   5\' 7"  (1.702 m)  Weight: 81.647 kg (180 lb)   81.9 kg (180 lb 8.9 oz)  SpO2: 100%  99% 96%    Constitutional: Vital signs reviewed.  Patient is in no acute distress and cooperative with exam. Alert and oriented x3.  Head: Normocephalic and atraumatic Ear: TM normal bilaterally Mouth: no erythema or exudates, MMM Eyes: PERRL, EOMI, conjunctivae normal, No scleral icterus.  Neck: Supple, Trachea midline normal ROM, No JVD, mass, thyromegaly, or carotid bruit present.  Cardiovascular: RRR, S1 normal, S2 normal, no MRG, pulses symmetric and intact bilaterally Pulmonary/Chest: CTAB, no wheezes, rales, or rhonchi Abdominal: Soft. Non-tender, non-distended, bowel sounds are normal, no masses, organomegaly, or guarding present.  GU: no CVA  tenderness Musculoskeletal: No joint deformities, erythema, or stiffness, ROM full and no nontender Ext: no edema and no cyanosis, pulses palpable bilaterally (DP and PT) Hematology: no cervical, inginal, or axillary adenopathy.  Neurological: A&O x3, Strenght is normal and symmetric bilaterally, cranial nerve II-XII are grossly intact, no focal motor deficit, sensory intact to light touch bilaterally.  Skin: Warm, dry and intact. No rash, cyanosis, or clubbing.  Psychiatric: Normal mood and affect. speech and behavior is normal. Judgment and thought content normal. Cognition and memory are normal.   Labs on Admission:  Results for orders placed during the hospital encounter of 08/08/11 (from the past 48 hour(s))  CBC     Status: Abnormal   Collection Time   08/08/11  1:54 PM      Component Value Range Comment   WBC 9.1  4.0 - 10.5 (K/uL)    RBC 4.72  3.87 - 5.11 (MIL/uL)    Hemoglobin 13.3  12.0 - 15.0 (g/dL)    HCT 16.1  09.6 - 04.5 (%)    MCV 87.1  78.0 - 100.0 (fL)    MCH 28.2  26.0 - 34.0 (pg)    MCHC 32.4  30.0 - 36.0 (g/dL)    RDW 40.9  81.1 - 91.4 (%)    Platelets  458 (*) 150 - 400 (K/uL)   DIFFERENTIAL     Status: Abnormal   Collection Time   08/08/11  1:54 PM      Component Value Range Comment   Neutrophils Relative 89 (*) 43 - 77 (%)    Neutro Abs 8.0 (*) 1.7 - 7.7 (K/uL)    Lymphocytes Relative 8 (*) 12 - 46 (%)    Lymphs Abs 0.7  0.7 - 4.0 (K/uL)    Monocytes Relative 3  3 - 12 (%)    Monocytes Absolute 0.3  0.1 - 1.0 (K/uL)    Eosinophils Relative 0  0 - 5 (%)    Eosinophils Absolute 0.0  0.0 - 0.7 (K/uL)    Basophils Relative 0  0 - 1 (%)    Basophils Absolute 0.0  0.0 - 0.1 (K/uL)   COMPREHENSIVE METABOLIC PANEL     Status: Abnormal   Collection Time   08/08/11  1:54 PM      Component Value Range Comment   Sodium 141  135 - 145 (mEq/L)    Potassium 3.4 (*) 3.5 - 5.1 (mEq/L)    Chloride 98  96 - 112 (mEq/L)    CO2 24  19 - 32 (mEq/L)    Glucose, Bld 199 (*) 70  - 99 (mg/dL)    BUN 11  6 - 23 (mg/dL)    Creatinine, Ser 1.61  0.50 - 1.10 (mg/dL)    Calcium 09.6  8.4 - 10.5 (mg/dL)    Total Protein 8.2  6.0 - 8.3 (g/dL)    Albumin 3.7  3.5 - 5.2 (g/dL)    AST 24  0 - 37 (U/L)    ALT 46 (*) 0 - 35 (U/L)    Alkaline Phosphatase 103  39 - 117 (U/L)    Total Bilirubin 1.4 (*) 0.3 - 1.2 (mg/dL)    GFR calc non Af Amer >90  >90 (mL/min)    GFR calc Af Amer >90  >90 (mL/min)   LIPASE, BLOOD     Status: Normal   Collection Time   08/08/11  1:54 PM      Component Value Range Comment   Lipase 29  11 - 59 (U/L)   PRO B NATRIURETIC PEPTIDE     Status: Abnormal   Collection Time   08/08/11  1:54 PM      Component Value Range Comment   Pro B Natriuretic peptide (BNP) 5480.0 (*) 0 - 125 (pg/mL)   TROPONIN I     Status: Normal   Collection Time   08/08/11  1:54 PM      Component Value Range Comment   Troponin I <0.30  <0.30 (ng/mL)   GLUCOSE, CAPILLARY     Status: Abnormal   Collection Time   08/08/11  2:19 PM      Component Value Range Comment   Glucose-Capillary 210 (*) 70 - 99 (mg/dL)   URINALYSIS, ROUTINE W REFLEX MICROSCOPIC     Status: Abnormal   Collection Time   08/08/11  2:22 PM      Component Value Range Comment   Color, Urine YELLOW  YELLOW     APPearance CLEAR  CLEAR     Specific Gravity, Urine 1.025  1.005 - 1.030     pH 6.0  5.0 - 8.0     Glucose, UA 250 (*) NEGATIVE (mg/dL)    Hgb urine dipstick LARGE (*) NEGATIVE     Bilirubin Urine NEGATIVE  NEGATIVE     Ketones,  ur 40 (*) NEGATIVE (mg/dL)    Protein, ur >161 (*) NEGATIVE (mg/dL)    Urobilinogen, UA 1.0  0.0 - 1.0 (mg/dL)    Nitrite NEGATIVE  NEGATIVE     Leukocytes, UA TRACE (*) NEGATIVE    PREGNANCY, URINE     Status: Normal   Collection Time   08/08/11  2:22 PM      Component Value Range Comment   Preg Test, Ur NEGATIVE  NEGATIVE    URINE MICROSCOPIC-ADD ON     Status: Abnormal   Collection Time   08/08/11  2:22 PM      Component Value Range Comment   Squamous Epithelial /  LPF MANY (*) RARE     WBC, UA 11-20  <3 (WBC/hpf)    RBC / HPF 11-20  <3 (RBC/hpf)    Bacteria, UA FEW (*) RARE      Radiological Exams on Admission: No results found.  Time Spent on Admission: Over 30 minutes  Graham Doukas 08/08/2011, 9:21 PM  [facility] Pager # [phone] Main Office # [phone]

## 2011-08-08 NOTE — ED Provider Notes (Signed)
Date: 08/08/2011  Rate: 107  Rhythm: sinus tachycardia and premature ventricular contractions (PVC)  QRS Axis: left  Intervals: normal  ST/T Wave abnormalities: normal  Conduction Disutrbances:none  Narrative Interpretation:   Old EKG Reviewed: unchanged  New onset CHF seen on cxr and labs.  Will administer lasix and await read from CT abd/pel.  Will require admission    Dayton Bailiff, MD 08/08/11 [phone]

## 2011-08-09 DIAGNOSIS — I10 Essential (primary) hypertension: Secondary | ICD-10-CM

## 2011-08-09 DIAGNOSIS — E118 Type 2 diabetes mellitus with unspecified complications: Secondary | ICD-10-CM

## 2011-08-09 DIAGNOSIS — E1165 Type 2 diabetes mellitus with hyperglycemia: Secondary | ICD-10-CM

## 2011-08-09 DIAGNOSIS — K828 Other specified diseases of gallbladder: Secondary | ICD-10-CM | POA: Diagnosis present

## 2011-08-09 DIAGNOSIS — I5031 Acute diastolic (congestive) heart failure: Secondary | ICD-10-CM

## 2011-08-09 DIAGNOSIS — E782 Mixed hyperlipidemia: Secondary | ICD-10-CM

## 2011-08-09 LAB — GLUCOSE, CAPILLARY: Glucose-Capillary: 146 mg/dL — ABNORMAL HIGH (ref 70–99)

## 2011-08-09 LAB — CARDIAC PANEL(CRET KIN+CKTOT+MB+TROPI)
CK, MB: 2.4 ng/mL (ref 0.3–4.0)
CK, MB: 2.4 ng/mL (ref 0.3–4.0)
Relative Index: 1.5 (ref 0.0–2.5)
Total CK: 155 U/L (ref 7–177)
Troponin I: <0.3 ng/mL (ref <0.30)

## 2011-08-09 LAB — BASIC METABOLIC PANEL
CO2: 27 mEq/L (ref 19–32)
Chloride: 101 mEq/L (ref 96–112)
Creatinine, Ser: 0.82 mg/dL (ref 0.50–1.10)
GFR calc Af Amer: >90 mL/min (ref >90)
Sodium: 142 mEq/L (ref 135–145)

## 2011-08-09 LAB — TSH: TSH: 0.242 u[IU]/mL — ABNORMAL LOW (ref 0.350–4.500)

## 2011-08-09 MED ORDER — SODIUM CHLORIDE 0.9 % IJ SOLN
INTRAMUSCULAR | Status: AC
  Filled 2011-08-09: qty 3

## 2011-08-09 MED ORDER — POTASSIUM CHLORIDE CRYS ER 20 MEQ PO TBCR
40.0000 meq | EXTENDED_RELEASE_TABLET | Freq: Once | ORAL | Status: AC
  Administered 2011-08-09: 40 meq via ORAL
  Filled 2011-08-09: qty 2

## 2011-08-09 MED ORDER — PROMETHAZINE HCL 25 MG/ML IJ SOLN
12.5000 mg | Freq: Four times a day (QID) | INTRAMUSCULAR | Status: DC | PRN
  Administered 2011-08-09 – 2011-08-13 (×14): 12.5 mg via INTRAVENOUS
  Filled 2011-08-09 (×14): qty 1

## 2011-08-09 MED ORDER — HYDRALAZINE HCL 20 MG/ML IJ SOLN
INTRAMUSCULAR | Status: AC
  Filled 2011-08-09: qty 1

## 2011-08-09 MED ORDER — HYDRALAZINE HCL 20 MG/ML IJ SOLN
10.0000 mg | Freq: Once | INTRAMUSCULAR | Status: AC
  Administered 2011-08-09: 10 mg via INTRAVENOUS

## 2011-08-09 MED ORDER — MORPHINE SULFATE 2 MG/ML IJ SOLN
2.0000 mg | INTRAMUSCULAR | Status: DC | PRN
  Administered 2011-08-09 – 2011-08-13 (×15): 2 mg via INTRAVENOUS
  Filled 2011-08-09 (×16): qty 1

## 2011-08-09 MED ORDER — ENOXAPARIN SODIUM 40 MG/0.4ML ~~LOC~~ SOLN
40.0000 mg | SUBCUTANEOUS | Status: DC
  Administered 2011-08-09 – 2011-08-12 (×4): 40 mg via SUBCUTANEOUS
  Filled 2011-08-09 (×4): qty 0.4

## 2011-08-09 MED ORDER — FUROSEMIDE 10 MG/ML IJ SOLN
40.0000 mg | Freq: Two times a day (BID) | INTRAMUSCULAR | Status: DC
  Administered 2011-08-09 – 2011-08-11 (×4): 40 mg via INTRAVENOUS
  Filled 2011-08-09 (×4): qty 4

## 2011-08-09 NOTE — Progress Notes (Signed)
[facility] PROGRESS NOTE  Lauren Steele NWG:[id number] DOB: 1970/03/19 DOA: 08/08/2011 PCP: Lilyan Punt, MD, MD Chief Complaint  Patient presents with  . Abdominal Pain  . n/v     Assessment/Plan: 1. Abdominal pain , nausea and vomiting - looking back at patient's medical record she has had a fairly similar presentation back in 2012 as well as March 2013. Etiology is presumed to be biliary dyskinesia. Will proceed with symptomatic treatment with antiemetics and analgesics. LFTs fairly unremarkable. 2. CHF syndrome based on CXR and proBNP findings. I would not be surprised to find she has diastolic dysfunction due to HTN and DM. Diuresing and await 2 D echocardiogram result 3. Hypokalemia - replete  4. Presumed UTI - urine culture pending. Continue empiric rocephin for now  5. DM 2 - good control. No change in Insulin dose  Principal Problem:  *Acute exacerbation of CHF (congestive heart failure) Active Problems:  Hypokalemia  Type II diabetes mellitus with complication, uncontrolled  HTN (hypertension)  UTI (lower urinary tract infection)  Abdominal pain  Biliary dyskinesia  Code Status: full Family CommunicationFayrene Fearing - [phone]  Disposition Plan: home  Lajoy Vanamburg, MD  [facility] Pager [phone]  If 7PM-7AM, please contact night-coverage [email or web address] Password [email or web address] 08/09/2011, 2:39 PM   LOS: 1 day   Antibiotics:  Rocephin 5/24  HPI/Subjective: Still with severe nausea and abdominal pain  Objective: Filed Vitals:   08/08/11 2329 08/09/11 0500 08/09/11 0637 08/09/11 0727  BP: 160/98  144/94   Pulse:   101 110  Temp:   98.3 F (36.8 C)   TempSrc:      Resp:   16   Height:      Weight:  80.1 kg (176 lb 9.4 oz) 80.1 kg (176 lb 9.4 oz)   SpO2:   96% 96%   No intake or output data in the 24 hours ending 08/09/11 1439  Exam:   General:  Alert and oriented x3  Cardiovascular: RRR, 2/6 systolic murmur  Respiratory:  ctab  Abdomen: soft, NT  Data Reviewed: Basic Metabolic Panel:  Lab 08/09/11 3244 08/08/11 2118 08/08/11 1354  NA 142 139 141  K 3.5 3.4* --  CL 101 96 98  CO2 27 22 24   GLUCOSE 132* 245* 199*  BUN 15 11 11   CREATININE 0.82 0.69 0.66  CALCIUM 9.4 9.8 10.3  MG -- 1.7 --  PHOS -- -- --   Liver Function Tests:  Lab 08/08/11 2118 08/08/11 1354  AST 20 24  ALT 44* 46*  ALKPHOS 98 103  BILITOT 0.8 1.4*  PROT 7.8 8.2  ALBUMIN 3.4* 3.7    Lab 08/08/11 1354  LIPASE 29  AMYLASE --   No results found for this basename: AMMONIA:5 in the last 168 hours CBC:  Lab 08/08/11 1354  WBC 9.1  NEUTROABS 8.0*  HGB 13.3  HCT 41.1  MCV 87.1  PLT 458*   Cardiac Enzymes:  Lab 08/09/11 1325 08/09/11 0630 08/08/11 2118 08/08/11 1354  CKTOTAL 146 155 202* --  CKMB 2.4 2.4 2.7 --  CKMBINDEX -- -- -- --  TROPONINI <0.30 <0.30 <0.30 <0.30   BNP (last 3 results)  Basename 08/08/11 1354  PROBNP 5480.0*   CBG:  Lab 08/09/11 1112 08/09/11 0719 08/08/11 2345 08/08/11 1419  GLUCAP 163* 124* 189* 210*    No results found for this or any previous visit (from the past 240 hour(s)).   Studies: Ct Abdomen Pelvis Wo Contrast  08/08/2011  *RADIOLOGY REPORT*  Clinical Data:  Abdominal pain with nausea and vomiting.  CT ABDOMEN AND PELVIS WITHOUT CONTRAST  Technique:  Multidetector CT imaging of the abdomen and pelvis was performed following the standard protocol without intravenous contrast.  Comparison: Radiographs dated 08/08/2011 and CT scan of the abdomen and pelvis dated 11/09/2009  Findings: There is a complex 4 cm cystic lesion on the right ovary which is new since the prior study.  There is a 2.5 cm cyst on the left ovary.  This is also new.  Uterus is normal.  No free air or free fluid in the abdomen.  Scattered diverticula in the left side of the colon.  No acute diverticulitis.  Terminal ileum and appendix are normal.  Liver, spleen, pancreas, adrenal glands, and kidneys are normal.  Gallbladder appears normal.  There is appreciable increasing cardiomegaly since the prior study. No effusions.  No acute osseous abnormality.  There appear to be some inflammatory changes of the top of the buttock crease with edema in the soft tissues at that area, new since the prior study.  IMPRESSION:  1.  Bilateral ovarian cysts.  The cyst on the right is complex.  I recommend pelvic follow-up ultrasound in 2 months. 2.  Increased cardiomegaly. 3.  Possible cellulitis at the top of the buttock crease.  Original Report Authenticated By: Gwynn Burly, M.D.   Dg Abd Acute W/chest  08/08/2011  *RADIOLOGY REPORT*  Clinical Data: Abdominal pain with nausea, vomiting, and diarrhea.  ACUTE ABDOMEN SERIES (ABDOMEN 2 VIEW & CHEST 1 VIEW)  Comparison: Chest x-ray dated 11/13/2009 and a CT scan of the abdomen dated 11/09/2009  Findings: There is increased cardiomegaly with slight bilateral interstitial pulmonary edema and slight pulmonary vascular prominence.  There is no free air in the abdomen.  There is minimal air within the nondistended bowel.  Benign-appearing calcifications in the pelvis.  No osseous abnormality.  IMPRESSION: Findings are consistent with mild congestive heart failure with cardiomegaly and mild interstitial pulmonary edema.  Benign- appearing abdomen.  Original Report Authenticated By: Gwynn Burly, M.D.    Scheduled Meds:    . aspirin EC  81 mg Oral Daily  . cefTRIAXone (ROCEPHIN)  IV  1 g Intravenous Once  . cefTRIAXone (ROCEPHIN)  IV  1 g Intravenous Q24H  . enoxaparin  40 mg Subcutaneous Q24H  . furosemide  40 mg Intravenous Once  . furosemide  40 mg Intravenous BID  . insulin aspart  0-15 Units Subcutaneous TID WC  . insulin aspart  0-5 Units Subcutaneous QHS  . insulin glargine  20 Units Subcutaneous QHS  . lisinopril  10 mg Oral Daily  . metoCLOPramide (REGLAN) injection  10 mg Intravenous Once  . metoprolol tartrate  25 mg Oral Q12H  .  morphine injection  4 mg  Intravenous Once  .  morphine injection  4 mg Intravenous Once  .  morphine injection  4 mg Intravenous Once  . ondansetron  4 mg Intravenous Once  . ondansetron  4 mg Intravenous Once  . pantoprazole  40 mg Oral Q1200  . potassium chloride  40 mEq Oral Once  . potassium chloride  40 mEq Oral Once  . sodium chloride  1,000 mL Intravenous Once  . sodium chloride  3 mL Intravenous Q12H  . sodium chloride      . DISCONTD: enoxaparin  30 mg Subcutaneous Q24H  . DISCONTD: furosemide  40 mg Intravenous Q12H  . DISCONTD: furosemide  40 mg Intravenous Daily   Continuous Infusions:

## 2011-08-10 DIAGNOSIS — E118 Type 2 diabetes mellitus with unspecified complications: Secondary | ICD-10-CM

## 2011-08-10 DIAGNOSIS — N83209 Unspecified ovarian cyst, unspecified side: Secondary | ICD-10-CM | POA: Diagnosis present

## 2011-08-10 DIAGNOSIS — I10 Essential (primary) hypertension: Secondary | ICD-10-CM

## 2011-08-10 DIAGNOSIS — E1165 Type 2 diabetes mellitus with hyperglycemia: Secondary | ICD-10-CM

## 2011-08-10 DIAGNOSIS — I5031 Acute diastolic (congestive) heart failure: Secondary | ICD-10-CM

## 2011-08-10 LAB — GLUCOSE, CAPILLARY
Glucose-Capillary: 167 mg/dL — ABNORMAL HIGH (ref 70–99)
Glucose-Capillary: 204 mg/dL — ABNORMAL HIGH (ref 70–99)
Glucose-Capillary: 169 mg/dL — ABNORMAL HIGH (ref 70–99)

## 2011-08-10 LAB — URINE CULTURE: Colony Count: 75000

## 2011-08-10 LAB — BASIC METABOLIC PANEL
CO2: 27 mEq/L (ref 19–32)
Calcium: 9.7 mg/dL (ref 8.4–10.5)
Creatinine, Ser: 0.76 mg/dL (ref 0.50–1.10)
GFR calc non Af Amer: >90 mL/min (ref >90)
Glucose, Bld: 152 mg/dL — ABNORMAL HIGH (ref 70–99)

## 2011-08-10 MED ORDER — LISINOPRIL 10 MG PO TABS
20.0000 mg | ORAL_TABLET | Freq: Every day | ORAL | Status: DC
  Administered 2011-08-11: 20 mg via ORAL
  Filled 2011-08-10: qty 1
  Filled 2011-08-10: qty 2

## 2011-08-10 MED ORDER — SODIUM CHLORIDE 0.9 % IJ SOLN
INTRAMUSCULAR | Status: AC
  Filled 2011-08-10: qty 3

## 2011-08-10 MED ORDER — POTASSIUM CHLORIDE CRYS ER 20 MEQ PO TBCR
40.0000 meq | EXTENDED_RELEASE_TABLET | Freq: Two times a day (BID) | ORAL | Status: DC
  Administered 2011-08-10 – 2011-08-11 (×4): 40 meq via ORAL
  Filled 2011-08-10 (×4): qty 2

## 2011-08-10 MED ORDER — HYDRALAZINE HCL 20 MG/ML IJ SOLN
20.0000 mg | INTRAMUSCULAR | Status: DC | PRN
  Administered 2011-08-10 – 2011-08-11 (×2): 20 mg via INTRAVENOUS
  Filled 2011-08-10 (×3): qty 1

## 2011-08-10 MED ORDER — METOPROLOL TARTRATE 50 MG PO TABS
50.0000 mg | ORAL_TABLET | Freq: Two times a day (BID) | ORAL | Status: DC
  Administered 2011-08-10 – 2011-08-11 (×3): 50 mg via ORAL
  Filled 2011-08-10 (×3): qty 1

## 2011-08-10 NOTE — Progress Notes (Signed)
B/P 162/102 Pulse: 120 Hydralazine 20mg  IV PRN given.  Will continue to monitor.

## 2011-08-10 NOTE — Progress Notes (Signed)
MD pages and made aware of patients increase in BP, after new order hydralazine 10mg  IV once patients BP still did not go down so MD paged and notified again. Waiting on further orders.

## 2011-08-10 NOTE — Progress Notes (Signed)
Subjective: Tolerating a diet, but still requiring Phenergan for nausea. No shortness of breath. Has had a cough productive of frothy white sputum for several weeks now however. Denies dysuria or flank pain. No chest pain. Reports leg swelling after last discharge, helped by Hydrouricil.  Objective: Vital signs in last 24 hours: Filed Vitals:   08/10/11 0330 08/10/11 0505 08/10/11 0738 08/10/11 1229  BP: 144/88 149/95  162/102  Pulse:  118 122 120  Temp:  98.3 F (36.8 C)    TempSrc:      Resp:  16    Height:      Weight:  80 kg (176 lb 5.9 oz)    SpO2:  94% 95%    Weight change: -1.647 kg (-3 lb 10.1 oz)  Intake/Output Summary (Last 24 hours) at 08/10/11 1338 Last data filed at 08/09/11 1700  Gross per 24 hour  Intake      0 ml  Output    400 ml  Net   -400 ml   Telemetry: Tachycardia, appears to be sinus  General comfortable Lungs clear to auscultation bilaterally without wheeze rhonchi or rales Cardiovascular tachycardic, regular without murmurs gallops Abdomen: Soft nontender nondistended Extremities no clubbing cyanosis or edema  Lab Results: Basic Metabolic Panel:  Lab 08/10/11 9147 08/09/11 0630 08/08/11 2118  NA 138 142 --  K 3.0* 3.5 --  CL 96 101 --  CO2 27 27 --  GLUCOSE 152* 132* --  BUN 19 15 --  CREATININE 0.76 0.82 --  CALCIUM 9.7 9.4 --  MG -- -- 1.7  PHOS -- -- --   Liver Function Tests:  Lab 08/08/11 2118 08/08/11 1354  AST 20 24  ALT 44* 46*  ALKPHOS 98 103  BILITOT 0.8 1.4*  PROT 7.8 8.2  ALBUMIN 3.4* 3.7    Lab 08/08/11 1354  LIPASE 29  AMYLASE --   No results found for this basename: AMMONIA:2 in the last 168 hours CBC:  Lab 08/08/11 1354  WBC 9.1  NEUTROABS 8.0*  HGB 13.3  HCT 41.1  MCV 87.1  PLT 458*   Cardiac Enzymes:  Lab 08/09/11 1325 08/09/11 0630 08/08/11 2118  CKTOTAL 146 155 202*  CKMB 2.4 2.4 2.7  CKMBINDEX -- -- --  TROPONINI <0.30 <0.30 <0.30   BNP:  Lab 08/08/11 1354  PROBNP 5480.0*    D-Dimer: No results found for this basename: DDIMER:2 in the last 168 hours CBG:  Lab 08/10/11 1130 08/10/11 0807 08/09/11 2111 08/09/11 1633 08/09/11 1112 08/09/11 0719  GLUCAP 167* 157* 115* 146* 163* 124*   Hemoglobin A1C: No results found for this basename: HGBA1C in the last 168 hours Fasting Lipid Panel:  Lab 08/08/11 2118  CHOL 198  HDL 78  LDLCALC 97  TRIG 115  CHOLHDL 2.5  LDLDIRECT --   Thyroid Function Tests:  Lab 08/08/11 2118  TSH 0.242*  T4TOTAL --  FREET4 --  T3FREE --  THYROIDAB --   Coagulation:  Lab 08/08/11 2118  LABPROT 13.8  INR 1.04   Anemia Panel: No results found for this basename: VITAMINB12,FOLATE,FERRITIN,TIBC,IRON,RETICCTPCT in the last 168 hours Urine Drug Screen: Drugs of Abuse     Component Value Date/Time   LABOPIA POSITIVE* 11/10/2009 2040   COCAINSCRNUR NONE DETECTED 11/10/2009 2040   LABBENZ NONE DETECTED 11/10/2009 2040   AMPHETMU NONE DETECTED 11/10/2009 2040   THCU NONE DETECTED 11/10/2009 2040   LABBARB  Value: NONE DETECTED        DRUG SCREEN FOR MEDICAL PURPOSES ONLY.  IF CONFIRMATION IS NEEDED FOR ANY PURPOSE, NOTIFY LAB WITHIN 5 DAYS.        LOWEST DETECTABLE LIMITS FOR URINE DRUG SCREEN Drug Class       Cutoff (ng/mL) Amphetamine      1000 Barbiturate      200 Benzodiazepine   200 Tricyclics       300 Opiates          300 Cocaine          300 THC              50 11/10/2009 2040    Alcohol Level: No results found for this basename: ETH:2 in the last 168 hours Urinalysis:  Lab 08/08/11 1422  COLORURINE YELLOW  LABSPEC 1.025  PHURINE 6.0  GLUCOSEU 250*  HGBUR LARGE*  BILIRUBINUR NEGATIVE  KETONESUR 40*  PROTEINUR >300*  UROBILINOGEN 1.0  NITRITE NEGATIVE  LEUKOCYTESUR TRACE*    Micro Results: Recent Results (from the past 240 hour(s))  URINE CULTURE     Status: Normal   Collection Time   08/08/11  3:52 PM      Component Value Range Status Comment   Specimen Description URINE, CLEAN CATCH   Final    Special  Requests NONE   Final    Culture  Setup Time [id number]   Final    Colony Count 75,000 COLONIES/ML   Final    Culture     Final    Value: LACTOBACILLUS SPECIES     Note: Standardized susceptibility testing for this organism is not available.   Report Status 08/10/2011 FINAL   Final    Studies/Results: Ct Abdomen Pelvis Wo Contrast  08/08/2011  *RADIOLOGY REPORT*  Clinical Data: Abdominal pain with nausea and vomiting.  CT ABDOMEN AND PELVIS WITHOUT CONTRAST  Technique:  Multidetector CT imaging of the abdomen and pelvis was performed following the standard protocol without intravenous contrast.  Comparison: Radiographs dated 08/08/2011 and CT scan of the abdomen and pelvis dated 11/09/2009  Findings: There is a complex 4 cm cystic lesion on the right ovary which is new since the prior study.  There is a 2.5 cm cyst on the left ovary.  This is also new.  Uterus is normal.  No free air or free fluid in the abdomen.  Scattered diverticula in the left side of the colon.  No acute diverticulitis.  Terminal ileum and appendix are normal.  Liver, spleen, pancreas, adrenal glands, and kidneys are normal. Gallbladder appears normal.  There is appreciable increasing cardiomegaly since the prior study. No effusions.  No acute osseous abnormality.  There appear to be some inflammatory changes of the top of the buttock crease with edema in the soft tissues at that area, new since the prior study.  IMPRESSION:  1.  Bilateral ovarian cysts.  The cyst on the right is complex.  I recommend pelvic follow-up ultrasound in 2 months. 2.  Increased cardiomegaly. 3.  Possible cellulitis at the top of the buttock crease.  Original Report Authenticated By: Gwynn Burly, M.D.   Dg Abd Acute W/chest  08/08/2011  *RADIOLOGY REPORT*  Clinical Data: Abdominal pain with nausea, vomiting, and diarrhea.  ACUTE ABDOMEN SERIES (ABDOMEN 2 VIEW & CHEST 1 VIEW)  Comparison: Chest x-ray dated 11/13/2009 and a CT scan of the abdomen  dated 11/09/2009  Findings: There is increased cardiomegaly with slight bilateral interstitial pulmonary edema and slight pulmonary vascular prominence.  There is no free air in the abdomen.  There is minimal air  within the nondistended bowel.  Benign-appearing calcifications in the pelvis.  No osseous abnormality.  IMPRESSION: Findings are consistent with mild congestive heart failure with cardiomegaly and mild interstitial pulmonary edema.  Benign- appearing abdomen.  Original Report Authenticated By: Gwynn Burly, M.D.   Scheduled Meds:   . aspirin EC  81 mg Oral Daily  . cefTRIAXone (ROCEPHIN)  IV  1 g Intravenous Q24H  . enoxaparin  40 mg Subcutaneous Q24H  . furosemide  40 mg Intravenous BID  . hydrALAZINE      . hydrALAZINE  10 mg Intravenous Once  . insulin aspart  0-15 Units Subcutaneous TID WC  . insulin aspart  0-5 Units Subcutaneous QHS  . insulin glargine  20 Units Subcutaneous QHS  . lisinopril  20 mg Oral Daily  . metoprolol tartrate  50 mg Oral Q12H  . pantoprazole  40 mg Oral Q1200  . potassium chloride  40 mEq Oral Once  . potassium chloride  40 mEq Oral BID  . sodium chloride  3 mL Intravenous Q12H  . sodium chloride      . sodium chloride      . sodium chloride      . DISCONTD: furosemide  40 mg Intravenous Daily  . DISCONTD: lisinopril  10 mg Oral Daily  . DISCONTD: metoprolol tartrate  25 mg Oral Q12H   Continuous Infusions:  PRN Meds:.sodium chloride, acetaminophen, hydrALAZINE, morphine injection, promethazine, sodium chloride Assessment/Plan: Principal Problem:  *Acute exacerbation of CHF (congestive heart failure), await echocardiogram. Patient has lost 1.5 km since admission. Continue IV Lasix. Blood pressure needs better control. Will increase lisinopril and metoprolol.   Hypokalemia: Replete by mouth. Check magnesium level as well.   Type II diabetes mellitus with complication, fairly well controlled. Patient reports compliance with medications at  home, but has a history of noncompliance. Will check a hemoglobin A1c   HTN (hypertension), poorly controlled which will make CHF more difficult to manage. See above.   Biliary dyskinesia could be contributing to her nausea, but she may also have bowel edema causing her symptoms. Symptoms are improving nevertheless.   UTI (lower urinary tract infection)?: Patient had many squamous epithelial cells and urine culture only shows Lactobacillus. I suspect contamination, but will continue Rocephin for now and repeat urinalysis. Consider discontinuing antibiotics in.   Ovarian cyst: Needs outpatient pelvic ultrasound in 2 months.  Abnormal TSH: Likely euthyroid sick, but will check a free T4  Tachycardia, this is been a chronic problem for the patient. However will repeat a 12-lead EKG to further evaluate.   LOS: 2 days   Barbar Brede L 08/10/2011, 1:38 PM

## 2011-08-11 DIAGNOSIS — E782 Mixed hyperlipidemia: Secondary | ICD-10-CM

## 2011-08-11 DIAGNOSIS — R946 Abnormal results of thyroid function studies: Secondary | ICD-10-CM

## 2011-08-11 DIAGNOSIS — E118 Type 2 diabetes mellitus with unspecified complications: Secondary | ICD-10-CM

## 2011-08-11 DIAGNOSIS — E1165 Type 2 diabetes mellitus with hyperglycemia: Secondary | ICD-10-CM

## 2011-08-11 DIAGNOSIS — I5031 Acute diastolic (congestive) heart failure: Secondary | ICD-10-CM

## 2011-08-11 LAB — GLUCOSE, CAPILLARY
Glucose-Capillary: 213 mg/dL — ABNORMAL HIGH (ref 70–99)
Glucose-Capillary: 109 mg/dL — ABNORMAL HIGH (ref 70–99)
Glucose-Capillary: 146 mg/dL — ABNORMAL HIGH (ref 70–99)

## 2011-08-11 LAB — BASIC METABOLIC PANEL
BUN: 24 mg/dL — ABNORMAL HIGH (ref 6–23)
Calcium: 10.1 mg/dL (ref 8.4–10.5)
Creatinine, Ser: 0.98 mg/dL (ref 0.50–1.10)
GFR calc Af Amer: 81 mL/min — ABNORMAL LOW (ref >90)

## 2011-08-11 MED ORDER — HYDRALAZINE HCL 20 MG/ML IJ SOLN: 10.0000 mg | INTRAMUSCULAR | Status: DC | PRN

## 2011-08-11 MED ORDER — SODIUM CHLORIDE 0.9 % IJ SOLN
INTRAMUSCULAR | Status: AC
  Filled 2011-08-11: qty 3

## 2011-08-11 NOTE — Progress Notes (Signed)
Subjective: Patient says that her nausea and vomiting have resolved. She has significantly less abnormal pain. She has no chest pain or chest congestion.  Objective: Vital signs in last 24 hours: Filed Vitals:   08/10/11 1500 08/10/11 2055 08/11/11 0556 08/11/11 0610  BP: 146/91 121/76  180/126  Pulse: 122 118 112   Temp: 98.7 F (37.1 C) 98.6 F (37 C) 99.2 F (37.3 C)   TempSrc:      Resp: 16 20 20    Height:      Weight:   79.5 kg (175 lb 4.3 oz)   SpO2: 93% 95% 98%     Intake/Output Summary (Last 24 hours) at 08/11/11 1004 Last data filed at 08/10/11 1700  Gross per 24 hour  Intake    480 ml  Output    750 ml  Net   -270 ml    Weight change: -0.5 kg (-1 lb 1.6 oz)  Physical exam: Lungs: Coarse breath sounds, without audible wheezes or crackles. Heart: S1, S2, with sinus tachycardia. Abdomen: Positive bowel sounds, mildly obese, nontender, nondistended. Extremities: No pedal edema.  Lab Results: Basic Metabolic Panel:  Basename 08/11/11 0556 08/10/11 0620 08/08/11 2118  NA 137 138 --  K 3.4* 3.0* --  CL 96 96 --  CO2 26 27 --  GLUCOSE 164* 152* --  BUN 24* 19 --  CREATININE 0.98 0.76 --  CALCIUM 10.1 9.7 --  MG -- -- 1.7  PHOS -- -- --   Liver Function Tests:  [facility] 08/08/11 2118 08/08/11 1354  AST 20 24  ALT 44* 46*  ALKPHOS 98 103  BILITOT 0.8 1.4*  PROT 7.8 8.2  ALBUMIN 3.4* 3.7    Basename 08/08/11 1354  LIPASE 29  AMYLASE --   No results found for this basename: AMMONIA:2 in the last 72 hours CBC:  Basename 08/08/11 1354  WBC 9.1  NEUTROABS 8.0*  HGB 13.3  HCT 41.1  MCV 87.1  PLT 458*   Cardiac Enzymes:  Basename 08/09/11 1325 08/09/11 0630 08/08/11 2118  CKTOTAL 146 155 202*  CKMB 2.4 2.4 2.7  CKMBINDEX -- -- --  TROPONINI <0.30 <0.30 <0.30   BNP:  Basename 08/08/11 1354  PROBNP 5480.0*   D-Dimer: No results found for this basename: DDIMER:2 in the last 72 hours CBG:  Basename 08/11/11 0724 08/10/11 2114 08/10/11  1633 08/10/11 1130 08/10/11 0807 08/09/11 2111  GLUCAP 215* 169* 204* 167* 157* 115*   Hemoglobin A1C: No results found for this basename: HGBA1C in the last 72 hours Fasting Lipid Panel:  Basename 08/08/11 2118  CHOL 198  HDL 78  LDLCALC 97  TRIG 115  CHOLHDL 2.5  LDLDIRECT --   Thyroid Function Tests:  Basename 08/08/11 2118  TSH 0.242*  T4TOTAL --  FREET4 --  T3FREE --  THYROIDAB --   Anemia Panel: No results found for this basename: VITAMINB12,FOLATE,FERRITIN,TIBC,IRON,RETICCTPCT in the last 72 hours Coagulation:  Basename 08/08/11 2118  LABPROT 13.8  INR 1.04   Urine Drug Screen: Drugs of Abuse     Component Value Date/Time   LABOPIA POSITIVE* 11/10/2009 2040   COCAINSCRNUR NONE DETECTED 11/10/2009 2040   LABBENZ NONE DETECTED 11/10/2009 2040   AMPHETMU NONE DETECTED 11/10/2009 2040   THCU NONE DETECTED 11/10/2009 2040   LABBARB  Value: NONE DETECTED        DRUG SCREEN FOR MEDICAL PURPOSES ONLY.  IF CONFIRMATION IS NEEDED FOR ANY PURPOSE, NOTIFY LAB WITHIN 5 DAYS.        LOWEST  DETECTABLE LIMITS FOR URINE DRUG SCREEN Drug Class       Cutoff (ng/mL) Amphetamine      1000 Barbiturate      200 Benzodiazepine   200 Tricyclics       300 Opiates          300 Cocaine          300 THC              50 11/10/2009 2040    Alcohol Level: No results found for this basename: ETH:2 in the last 72 hours Urinalysis:  Basename 08/08/11 1422  COLORURINE YELLOW  LABSPEC 1.025  PHURINE 6.0  GLUCOSEU 250*  HGBUR LARGE*  BILIRUBINUR NEGATIVE  KETONESUR 40*  PROTEINUR >300*  UROBILINOGEN 1.0  NITRITE NEGATIVE  LEUKOCYTESUR TRACE*   Misc. Labs:   Micro: Recent Results (from the past 240 hour(s))  URINE CULTURE     Status: Normal   Collection Time   08/08/11  3:52 PM      Component Value Range Status Comment   Specimen Description URINE, CLEAN CATCH   Final    Special Requests NONE   Final    Culture  Setup Time [id number]   Final    Colony Count 75,000 COLONIES/ML    Final    Culture     Final    Value: LACTOBACILLUS SPECIES     Note: Standardized susceptibility testing for this organism is not available.   Report Status 08/10/2011 FINAL   Final     Studies/Results: No results found.  Medications:  Scheduled:   . aspirin EC  81 mg Oral Daily  . cefTRIAXone (ROCEPHIN)  IV  1 g Intravenous Q24H  . enoxaparin  40 mg Subcutaneous Q24H  . hydrALAZINE      . insulin aspart  0-15 Units Subcutaneous TID WC  . insulin aspart  0-5 Units Subcutaneous QHS  . insulin glargine  20 Units Subcutaneous QHS  . lisinopril  20 mg Oral Daily  . metoprolol tartrate  50 mg Oral Q12H  . pantoprazole  40 mg Oral Q1200  . potassium chloride  40 mEq Oral BID  . sodium chloride  3 mL Intravenous Q12H  . sodium chloride      . sodium chloride      . DISCONTD: furosemide  40 mg Intravenous BID  . DISCONTD: lisinopril  10 mg Oral Daily  . DISCONTD: metoprolol tartrate  25 mg Oral Q12H   Continuous:  WJX:BJYNWG chloride, acetaminophen, hydrALAZINE, morphine injection, promethazine, sodium chloride  Assessment: Principal Problem:  *Acute exacerbation of CHF (congestive heart failure) Active Problems:  Hypokalemia  Type II diabetes mellitus with complication, uncontrolled  HTN (hypertension)  UTI (lower urinary tract infection)  Abdominal pain  Biliary dyskinesia  Ovarian cyst   1. Pulmonary edema. This is being interpreted as congestive heart failure, however, 2-D echocardiogram has not been completed yet. She is being treated with IV Lasix.  Bilateral ovarian cysts, right greater than the left. The right ovarian cyst appears to be more complex. These findings could be contributing to her abdominal symptomatology, however, there was no mention of free air or free fluid in her abdomen. She will need a surveillance pelvic ultrasound in the next several months.  Urinary tract infection. She is currently on Rocephin.  History of biliary dyskinesia. We will refer  her to Gen. surgery following discharge for an elective evaluation for cholecystectomy.  Type 2 diabetes mellitus, generally uncontrolled. So far, the patient's blood sugars  have been below 200 during the past 2 days. We'll continue insulin as scheduled.  Malignant hypertension. This has been characteristic of the patient's hospitalization. The pattern has been that her blood pressure decreases following her acute illness. Antihypertensive medication doses were titrated up over the past 24 hours. The current doses will be maintained and will make adjustments accordingly.  Chronic sinus tachycardia. She has chronic sinus tachycardia.  Low TSH. Free T4 is pending.  History of abscesses of the buttock. CT shows some residual cellulitis.    Plan:  1. We'll discontinue IV Lasix for now. Will wait for the results of the 2-D echocardiogram when they are available. 2. We'll add a free T3 to the free T4 pending. 3. We'll change her diet to a carbohydrate modified diet.     LOS: 3 days   Dejean Tribby 08/11/2011, 10:04 AM

## 2011-08-12 ENCOUNTER — Inpatient Hospital Stay (HOSPITAL_COMMUNITY): Payer: BC Managed Care – PPO

## 2011-08-12 DIAGNOSIS — E1165 Type 2 diabetes mellitus with hyperglycemia: Secondary | ICD-10-CM

## 2011-08-12 DIAGNOSIS — I517 Cardiomegaly: Secondary | ICD-10-CM

## 2011-08-12 DIAGNOSIS — R946 Abnormal results of thyroid function studies: Secondary | ICD-10-CM

## 2011-08-12 DIAGNOSIS — E782 Mixed hyperlipidemia: Secondary | ICD-10-CM

## 2011-08-12 DIAGNOSIS — I5031 Acute diastolic (congestive) heart failure: Secondary | ICD-10-CM

## 2011-08-12 DIAGNOSIS — E118 Type 2 diabetes mellitus with unspecified complications: Secondary | ICD-10-CM

## 2011-08-12 DIAGNOSIS — I5189 Other ill-defined heart diseases: Secondary | ICD-10-CM

## 2011-08-12 HISTORY — DX: Other ill-defined heart diseases: I51.89

## 2011-08-12 LAB — BASIC METABOLIC PANEL
CO2: 27 mEq/L (ref 19–32)
Calcium: 9.4 mg/dL (ref 8.4–10.5)
Chloride: 97 mEq/L (ref 96–112)
Creatinine, Ser: 1.2 mg/dL — ABNORMAL HIGH (ref 0.50–1.10)
GFR calc Af Amer: 64 mL/min — ABNORMAL LOW (ref >90)
Sodium: 134 mEq/L — ABNORMAL LOW (ref 135–145)

## 2011-08-12 LAB — GLUCOSE, CAPILLARY: Glucose-Capillary: 108 mg/dL — ABNORMAL HIGH (ref 70–99)

## 2011-08-12 LAB — T3, FREE: T3, Free: 2.2 pg/mL — ABNORMAL LOW (ref 2.3–4.2)

## 2011-08-12 LAB — MRSA PCR SCREENING: MRSA by PCR: POSITIVE — AB

## 2011-08-12 LAB — T4, FREE: Free T4: 1.84 ng/dL — ABNORMAL HIGH (ref 0.80–1.80)

## 2011-08-12 MED ORDER — MUPIROCIN 2 % EX OINT
1.0000 "application " | TOPICAL_OINTMENT | Freq: Two times a day (BID) | CUTANEOUS | Status: DC
  Administered 2011-08-12 – 2011-08-13 (×3): 1 via NASAL
  Filled 2011-08-12: qty 22

## 2011-08-12 MED ORDER — INSULIN GLARGINE 100 UNIT/ML ~~LOC~~ SOLN
10.0000 [IU] | Freq: Every day | SUBCUTANEOUS | Status: DC
  Administered 2011-08-12: 10 [IU] via SUBCUTANEOUS

## 2011-08-12 MED ORDER — METOPROLOL TARTRATE 25 MG PO TABS
12.5000 mg | ORAL_TABLET | Freq: Two times a day (BID) | ORAL | Status: DC
  Administered 2011-08-12 – 2011-08-13 (×2): 12.5 mg via ORAL
  Filled 2011-08-12 (×2): qty 1

## 2011-08-12 MED ORDER — LISINOPRIL 10 MG PO TABS
10.0000 mg | ORAL_TABLET | Freq: Every day | ORAL | Status: DC
  Administered 2011-08-12 – 2011-08-13 (×2): 10 mg via ORAL
  Filled 2011-08-12 (×2): qty 1

## 2011-08-12 MED ORDER — CHLORHEXIDINE GLUCONATE CLOTH 2 % EX PADS
6.0000 | MEDICATED_PAD | Freq: Every day | CUTANEOUS | Status: DC
  Administered 2011-08-13: 6 via TOPICAL

## 2011-08-12 MED ORDER — SODIUM CHLORIDE 0.9 % IJ SOLN
INTRAMUSCULAR | Status: AC
  Administered 2011-08-12: 10 mL
  Filled 2011-08-12: qty 3

## 2011-08-12 MED ORDER — POTASSIUM CHLORIDE CRYS ER 20 MEQ PO TBCR
40.0000 meq | EXTENDED_RELEASE_TABLET | Freq: Three times a day (TID) | ORAL | Status: DC
  Administered 2011-08-12 – 2011-08-13 (×3): 40 meq via ORAL
  Filled 2011-08-12 (×2): qty 2
  Filled 2011-08-12: qty 1
  Filled 2011-08-12: qty 2

## 2011-08-12 MED ORDER — SODIUM CHLORIDE 0.9 % IJ SOLN
INTRAMUSCULAR | Status: AC
  Filled 2011-08-12: qty 3

## 2011-08-12 NOTE — Progress Notes (Signed)
Report received. Patient resting in bed. Assisted to bathroom. Patient denies any other needs at this time. Will continue to monitor.

## 2011-08-12 NOTE — Progress Notes (Signed)
*  PRELIMINARY RESULTS* Echocardiogram 2D Echocardiogram has been performed.  Caswell Corwin 08/12/2011, 10:27 AM

## 2011-08-12 NOTE — Progress Notes (Signed)
UR Chart Review Completed  

## 2011-08-12 NOTE — Progress Notes (Signed)
Subjective: She had mild nausea this morning. No vomiting. She has no abdominal pain. She has no chest pain or chest congestion.  Objective: Vital signs in last 24 hours: Filed Vitals:   08/11/11 1355 08/11/11 2100 08/12/11 0454 08/12/11 0519  BP: 135/91 98/65  111/77  Pulse: 112 105  83  Temp: 99.1 F (37.3 C) 97.6 F (36.4 C)  98.2 F (36.8 C)  TempSrc: Oral Oral  Oral  Resp: 20 16  16   Height:      Weight:   80.3 kg (177 lb 0.5 oz)   SpO2: 96% 94%  97%   No intake or output data in the 24 hours ending 08/12/11 1109  Weight change: 0.8 kg (1 lb 12.2 oz)  Physical exam: Lungs: Coarse breath sounds, without audible wheezes or crackles. Heart: S1, S2, with sinus tachycardia. Abdomen: Positive bowel sounds, mildly obese, nontender, nondistended. Extremities: No pedal edema.  Lab Results: Basic Metabolic Panel:  Basename 08/12/11 0557 08/11/11 0556  NA 134* 137  K 3.2* 3.4*  CL 97 96  CO2 27 26  GLUCOSE 68* 164*  BUN 25* 24*  CREATININE 1.20* 0.98  CALCIUM 9.4 10.1  MG -- --  PHOS -- --   Liver Function Tests: No results found for this basename: AST:2,ALT:2,ALKPHOS:2,BILITOT:2,PROT:2,ALBUMIN:2 in the last 72 hours No results found for this basename: LIPASE:2,AMYLASE:2 in the last 72 hours No results found for this basename: AMMONIA:2 in the last 72 hours CBC: No results found for this basename: WBC:2,NEUTROABS:2,HGB:2,HCT:2,MCV:2,PLT:2 in the last 72 hours Cardiac Enzymes:  Basename 08/09/11 1325  CKTOTAL 146  CKMB 2.4  CKMBINDEX --  TROPONINI <0.30   BNP: No results found for this basename: PROBNP:3 in the last 72 hours D-Dimer: No results found for this basename: DDIMER:2 in the last 72 hours CBG:  Basename 08/12/11 0744 08/11/11 2055 08/11/11 1635 08/11/11 1158 08/11/11 0724 08/10/11 2114  GLUCAP 87 146* 109* 213* 215* 169*   Hemoglobin A1C:  Basename 08/11/11 0556  HGBA1C 8.3*   Fasting Lipid Panel: No results found for this basename:  CHOL,HDL,LDLCALC,TRIG,CHOLHDL,LDLDIRECT in the last 72 hours Thyroid Function Tests:  Basename 08/11/11 1120 08/11/11 0556  TSH -- --  T4TOTAL -- --  FREET4 -- 1.84*  T3FREE 2.2* --  THYROIDAB -- --   Anemia Panel: No results found for this basename: VITAMINB12,FOLATE,FERRITIN,TIBC,IRON,RETICCTPCT in the last 72 hours Coagulation: No results found for this basename: LABPROT:2,INR:2 in the last 72 hours Urine Drug Screen: Drugs of Abuse     Component Value Date/Time   LABOPIA POSITIVE* 11/10/2009 2040   COCAINSCRNUR NONE DETECTED 11/10/2009 2040   LABBENZ NONE DETECTED 11/10/2009 2040   AMPHETMU NONE DETECTED 11/10/2009 2040   THCU NONE DETECTED 11/10/2009 2040   LABBARB  Value: NONE DETECTED        DRUG SCREEN FOR MEDICAL PURPOSES ONLY.  IF CONFIRMATION IS NEEDED FOR ANY PURPOSE, NOTIFY LAB WITHIN 5 DAYS.        LOWEST DETECTABLE LIMITS FOR URINE DRUG SCREEN Drug Class       Cutoff (ng/mL) Amphetamine      1000 Barbiturate      200 Benzodiazepine   200 Tricyclics       300 Opiates          300 Cocaine          300 THC              50 11/10/2009 2040    Alcohol Level: No results found for this basename: ETH:2  in the last 72 hours Urinalysis: No results found for this basename: COLORURINE:2,APPERANCEUR:2,LABSPEC:2,PHURINE:2,GLUCOSEU:2,HGBUR:2,BILIRUBINUR:2,KETONESUR:2,PROTEINUR:2,UROBILINOGEN:2,NITRITE:2,LEUKOCYTESUR:2 in the last 72 hours Misc. Labs:   Micro: Recent Results (from the past 240 hour(s))  URINE CULTURE     Status: Normal   Collection Time   08/08/11  3:52 PM      Component Value Range Status Comment   Specimen Description URINE, CLEAN CATCH   Final    Special Requests NONE   Final    Culture  Setup Time [id number]   Final    Colony Count 75,000 COLONIES/ML   Final    Culture     Final    Value: LACTOBACILLUS SPECIES     Note: Standardized susceptibility testing for this organism is not available.   Report Status 08/10/2011 FINAL   Final     Studies/Results: No  results found.  Medications:  Scheduled:    . aspirin EC  81 mg Oral Daily  . cefTRIAXone (ROCEPHIN)  IV  1 g Intravenous Q24H  . enoxaparin  40 mg Subcutaneous Q24H  . insulin aspart  0-15 Units Subcutaneous TID WC  . insulin aspart  0-5 Units Subcutaneous QHS  . insulin glargine  20 Units Subcutaneous QHS  . lisinopril  10 mg Oral Daily  . metoprolol tartrate  12.5 mg Oral Q12H  . pantoprazole  40 mg Oral Q1200  . potassium chloride  40 mEq Oral TID  . sodium chloride  3 mL Intravenous Q12H  . sodium chloride      . sodium chloride      . DISCONTD: lisinopril  20 mg Oral Daily  . DISCONTD: metoprolol tartrate  50 mg Oral Q12H  . DISCONTD: potassium chloride  40 mEq Oral BID   Continuous:  MVH:QIONGE chloride, acetaminophen, hydrALAZINE, morphine injection, promethazine, sodium chloride  Assessment: Principal Problem:  *Acute exacerbation of CHF (congestive heart failure) Active Problems:  Hypokalemia  Type II diabetes mellitus with complication, uncontrolled  HTN (hypertension)  UTI (lower urinary tract infection)  Abdominal pain  Biliary dyskinesia  Ovarian cyst   1. Pulmonary edema. This is being interpreted as congestive heart failure, however, 2-D echocardiogram results are pending. She is being treated with IV Lasix.  Bilateral ovarian cysts, right greater than the left. The right ovarian cyst appears to be more complex. These findings could be contributing to her abdominal symptomatology, however, there was no mention of free air or free fluid in her abdomen. She will need a surveillance pelvic ultrasound in the next several months.  Urinary tract infection. She is currently on Rocephin.  History of biliary dyskinesia. We will refer her to Gen. surgery following discharge for an elective evaluation for cholecystectomy.  Type 2 diabetes mellitus, generally uncontrolled. So far, the patient's blood sugars have been below 200 and are trending down.  Malignant  hypertension. This has been characteristic of the patient's hospitalization. The pattern has been that her blood pressure decreases following her acute illness. Her blood pressures are now in the 100s systolically. The current doses will be maintained and will make adjustments accordingly.  Chronic sinus tachycardia. She has chronic sinus tachycardia.  Low TSH, borderline high free T4, and borderline low free T3. These indices will need to be followed in the outpatient setting. She may benefit from an outpatient referral to an endocrinologist. She does not appear to have symptomatology of either hypothyroidism or hyperthyroidism.  History of abscesses of the buttock. CT shows some residual cellulitis.  Hypokalemia. This is being repleted orally.  Mild  renal insufficiency, noted over the past 24 hours. Her renal function will be followed closely.    Plan:  1. We'll order another chest x-ray to assess pulmonary edema. 2. Will check the results of the 2-D echocardiogram pending. 3. We'll need to follow her renal function as her creatinine is trending upward. 4. We'll decrease the doses of lisinopril and metoprolol to avoid symptomatic hypotension. 5. We'll decrease the dose of Lantus to avoid hypoglycemia.     LOS: 4 days   Lauren Steele 08/12/2011, 11:09 AM

## 2011-08-13 ENCOUNTER — Encounter (HOSPITAL_COMMUNITY): Payer: Self-pay | Admitting: Internal Medicine

## 2011-08-13 DIAGNOSIS — I10 Essential (primary) hypertension: Secondary | ICD-10-CM

## 2011-08-13 DIAGNOSIS — I428 Other cardiomyopathies: Secondary | ICD-10-CM

## 2011-08-13 DIAGNOSIS — E1165 Type 2 diabetes mellitus with hyperglycemia: Secondary | ICD-10-CM

## 2011-08-13 DIAGNOSIS — E118 Type 2 diabetes mellitus with unspecified complications: Secondary | ICD-10-CM

## 2011-08-13 DIAGNOSIS — I429 Cardiomyopathy, unspecified: Secondary | ICD-10-CM | POA: Diagnosis present

## 2011-08-13 DIAGNOSIS — I5021 Acute systolic (congestive) heart failure: Secondary | ICD-10-CM

## 2011-08-13 DIAGNOSIS — R946 Abnormal results of thyroid function studies: Secondary | ICD-10-CM

## 2011-08-13 DIAGNOSIS — E782 Mixed hyperlipidemia: Secondary | ICD-10-CM

## 2011-08-13 HISTORY — DX: Abnormal results of thyroid function studies: R94.6

## 2011-08-13 LAB — CBC
Hemoglobin: 14 g/dL (ref 12.0–15.0)
MCH: 28.6 pg (ref 26.0–34.0)
MCV: 87.7 fL (ref 78.0–100.0)
Platelets: 428 10*3/uL — ABNORMAL HIGH (ref 150–400)
RBC: 4.89 MIL/uL (ref 3.87–5.11)
RDW: 14.5 % (ref 11.5–15.5)

## 2011-08-13 LAB — BASIC METABOLIC PANEL
CO2: 27 mEq/L (ref 19–32)
Chloride: 102 mEq/L (ref 96–112)
Glucose, Bld: 106 mg/dL — ABNORMAL HIGH (ref 70–99)
Potassium: 4 mEq/L (ref 3.5–5.1)
Sodium: 139 mEq/L (ref 135–145)

## 2011-08-13 LAB — GLUCOSE, CAPILLARY: Glucose-Capillary: 183 mg/dL — ABNORMAL HIGH (ref 70–99)

## 2011-08-13 MED ORDER — FUROSEMIDE 40 MG PO TABS
40.0000 mg | ORAL_TABLET | Freq: Every day | ORAL | Status: DC
  Administered 2011-08-13: 40 mg via ORAL
  Filled 2011-08-13: qty 1

## 2011-08-13 MED ORDER — POTASSIUM CHLORIDE ER 10 MEQ PO TBCR: 10.0000 meq | EXTENDED_RELEASE_TABLET | Freq: Two times a day (BID) | ORAL | Status: DC

## 2011-08-13 MED ORDER — ASPIRIN 81 MG PO TBEC: 81.0000 mg | DELAYED_RELEASE_TABLET | Freq: Every day | ORAL | Status: AC

## 2011-08-13 MED ORDER — PROMETHAZINE HCL 12.5 MG PO TABS: 12.5000 mg | ORAL_TABLET | Freq: Four times a day (QID) | ORAL | Status: DC | PRN

## 2011-08-13 MED ORDER — FUROSEMIDE 40 MG PO TABS: 40.0000 mg | ORAL_TABLET | Freq: Every day | ORAL | Status: DC

## 2011-08-13 MED ORDER — CARVEDILOL 6.25 MG PO TABS: 6.2500 mg | ORAL_TABLET | Freq: Two times a day (BID) | ORAL | Status: DC

## 2011-08-13 MED ORDER — SPIRONOLACTONE 25 MG PO TABS
12.5000 mg | ORAL_TABLET | Freq: Two times a day (BID) | ORAL | Status: DC
  Administered 2011-08-13: 12.5 mg via ORAL
  Filled 2011-08-13: qty 1

## 2011-08-13 MED ORDER — INSULIN GLARGINE 100 UNIT/ML ~~LOC~~ SOLN: 15.0000 [IU] | Freq: Every day | SUBCUTANEOUS | Status: DC

## 2011-08-13 MED ORDER — CARVEDILOL 3.125 MG PO TABS
6.2500 mg | ORAL_TABLET | Freq: Two times a day (BID) | ORAL | Status: DC
  Administered 2011-08-13: 6.25 mg via ORAL
  Filled 2011-08-13: qty 2

## 2011-08-13 MED ORDER — SPIRONOLACTONE 12.5 MG HALF TABLET: 12.5000 mg | ORAL_TABLET | Freq: Two times a day (BID) | ORAL | Status: DC

## 2011-08-13 MED ORDER — SODIUM CHLORIDE 0.9 % IJ SOLN
INTRAMUSCULAR | Status: AC
  Administered 2011-08-13: 10 mL
  Filled 2011-08-13: qty 3

## 2011-08-13 NOTE — Progress Notes (Signed)
Prescriptions given states understanding of discharge. 

## 2011-08-13 NOTE — Progress Notes (Signed)
Subjective: She has no complaints of nausea, vomiting, chest pain, or shortness of breath.  Objective: Vital signs in last 24 hours: Filed Vitals:   08/12/11 0454 08/12/11 0519 08/12/11 2121 08/13/11 0529  BP:  111/77 91/64 113/78  Pulse:  83 96 94  Temp:  98.2 F (36.8 C) 98.4 F (36.9 C) 97.3 F (36.3 C)  TempSrc:  Oral Oral Oral  Resp:  16 18 19   Height:      Weight: 80.3 kg (177 lb 0.5 oz)   79.5 kg (175 lb 4.3 oz)  SpO2:  97% 98% 98%    Intake/Output Summary (Last 24 hours) at 08/13/11 1105 Last data filed at 08/13/11 0857  Gross per 24 hour  Intake    513 ml  Output      0 ml  Net    513 ml    Weight change: -0.8 kg (-1 lb 12.2 oz)  Physical exam: Lungs: Coarse breath sounds, without audible wheezes or crackles. Heart: S1, S2, with sinus tachycardia and ectopy. Abdomen: Positive bowel sounds, mildly obese, nontender, nondistended. Extremities: No pedal edema.  Lab Results: Basic Metabolic Panel:  Basename 08/13/11 0536 08/12/11 0557  NA 139 134*  K 4.0 3.2*  CL 102 97  CO2 27 27  GLUCOSE 106* 68*  BUN 26* 25*  CREATININE 0.96 1.20*  CALCIUM 9.3 9.4  MG -- --  PHOS -- --   Liver Function Tests: No results found for this basename: AST:2,ALT:2,ALKPHOS:2,BILITOT:2,PROT:2,ALBUMIN:2 in the last 72 hours No results found for this basename: LIPASE:2,AMYLASE:2 in the last 72 hours No results found for this basename: AMMONIA:2 in the last 72 hours CBC:  Basename 08/13/11 0536  WBC 5.4  NEUTROABS --  HGB 14.0  HCT 42.9  MCV 87.7  PLT 428*   Cardiac Enzymes: No results found for this basename: CKTOTAL:3,CKMB:3,CKMBINDEX:3,TROPONINI:3 in the last 72 hours BNP: No results found for this basename: PROBNP:3 in the last 72 hours D-Dimer: No results found for this basename: DDIMER:2 in the last 72 hours CBG:  Basename 08/13/11 1058 08/13/11 0735 08/12/11 2057 08/12/11 1623 08/12/11 1159 08/12/11 0744  GLUCAP 198* 105* 108* 155* 189* 87   Hemoglobin  A1C:  Basename 08/11/11 0556  HGBA1C 8.3*   Fasting Lipid Panel: No results found for this basename: CHOL,HDL,LDLCALC,TRIG,CHOLHDL,LDLDIRECT in the last 72 hours Thyroid Function Tests:  Basename 08/11/11 1120 08/11/11 0556  TSH -- --  T4TOTAL -- --  FREET4 -- 1.84*  T3FREE 2.2* --  THYROIDAB -- --   Anemia Panel: No results found for this basename: VITAMINB12,FOLATE,FERRITIN,TIBC,IRON,RETICCTPCT in the last 72 hours Coagulation: No results found for this basename: LABPROT:2,INR:2 in the last 72 hours Urine Drug Screen: Drugs of Abuse     Component Value Date/Time   LABOPIA POSITIVE* 11/10/2009 2040   COCAINSCRNUR NONE DETECTED 11/10/2009 2040   LABBENZ NONE DETECTED 11/10/2009 2040   AMPHETMU NONE DETECTED 11/10/2009 2040   THCU NONE DETECTED 11/10/2009 2040   LABBARB  Value: NONE DETECTED        DRUG SCREEN FOR MEDICAL PURPOSES ONLY.  IF CONFIRMATION IS NEEDED FOR ANY PURPOSE, NOTIFY LAB WITHIN 5 DAYS.        LOWEST DETECTABLE LIMITS FOR URINE DRUG SCREEN Drug Class       Cutoff (ng/mL) Amphetamine      1000 Barbiturate      200 Benzodiazepine   200 Tricyclics       300 Opiates          300 Cocaine  300 THC              50 11/10/2009 2040    Alcohol Level: No results found for this basename: ETH:2 in the last 72 hours Urinalysis: No results found for this basename: COLORURINE:2,APPERANCEUR:2,LABSPEC:2,PHURINE:2,GLUCOSEU:2,HGBUR:2,BILIRUBINUR:2,KETONESUR:2,PROTEINUR:2,UROBILINOGEN:2,NITRITE:2,LEUKOCYTESUR:2 in the last 72 hours Misc. Labs:   Micro: Recent Results (from the past 240 hour(s))  URINE CULTURE     Status: Normal   Collection Time   08/08/11  3:52 PM      Component Value Range Status Comment   Specimen Description URINE, CLEAN CATCH   Final    Special Requests NONE   Final    Culture  Setup Time [id number]   Final    Colony Count 75,000 COLONIES/ML   Final    Culture     Final    Value: LACTOBACILLUS SPECIES     Note: Standardized susceptibility  testing for this organism is not available.   Report Status 08/10/2011 FINAL   Final   MRSA PCR SCREENING     Status: Abnormal   Collection Time   08/12/11  9:09 AM      Component Value Range Status Comment   MRSA by PCR POSITIVE (*) NEGATIVE  Final     Studies/Results: Dg Chest 2 View  08/12/2011  *RADIOLOGY REPORT*  Clinical Data: Pulmonary edema  CHEST - 2 VIEW  Comparison: Chest radiograph 10/17/2009  Findings: Normal mediastinum and heart silhouette.  The heart is mildly enlarged which is similar to prior. There is mild air space disease in the lower lobes versus atelectasis.  IMPRESSION: Mild lower lobe atelectasis versus air space disease.  This could represent mild edema.  Original Report Authenticated By: Genevive Bi, M.D.    Medications:  Scheduled:    . aspirin EC  81 mg Oral Daily  . cefTRIAXone (ROCEPHIN)  IV  1 g Intravenous Q24H  . Chlorhexidine Gluconate Cloth  6 each Topical Q0600  . enoxaparin  40 mg Subcutaneous Q24H  . insulin aspart  0-15 Units Subcutaneous TID WC  . insulin aspart  0-5 Units Subcutaneous QHS  . insulin glargine  10 Units Subcutaneous QHS  . lisinopril  10 mg Oral Daily  . metoprolol tartrate  12.5 mg Oral Q12H  . mupirocin ointment  1 application Nasal BID  . pantoprazole  40 mg Oral Q1200  . potassium chloride  40 mEq Oral TID  . sodium chloride  3 mL Intravenous Q12H  . sodium chloride      . sodium chloride      . sodium chloride      . DISCONTD: insulin glargine  20 Units Subcutaneous QHS  . DISCONTD: potassium chloride  40 mEq Oral BID   Continuous:  NFA:OZHYQM chloride, acetaminophen, hydrALAZINE, morphine injection, promethazine, sodium chloride  Assessment: Principal Problem:  *Acute systolic CHF (congestive heart failure) Active Problems:  Hypokalemia  Type II diabetes mellitus with complication, uncontrolled  HTN (hypertension)  UTI (lower urinary tract infection)  Abdominal pain  Biliary dyskinesia  Ovarian cyst   Cardiomyopathy  Abnormal thyroid function test   1. Pulmonary edema, secondary to newly diagnosed acute systolic congestive heart failure. She is already on an ACE inhibitor and a beta blocker. Discussed with Dr. Dietrich Pates. The patient's ejection fraction is estimated to be 15-20%, along with some other abnormalities noted. Will restart Lasix orally. Of note, her weight on admission was 81.6 kg. Today it is 79.5 kg.  Malignant hypertension. This has been characteristic of the patient's hospitalization. The pattern has  been that her blood pressure decreases following her acute illness. Her blood pressures are now in the 100s systolically. The doses of metoprolol and lisinopril were decreased yesterday to avoid symptomatic hypotension.  Bilateral ovarian cysts, right greater than the left. The right ovarian cyst appears to be more complex. These findings could be contributing to her abdominal symptomatology, however, there was no mention of free air or free fluid in her abdomen. She will need a surveillance pelvic ultrasound in the next several months.  Urinary tract infection. She is currently on Rocephin.  History of biliary dyskinesia. We will refer her to Gen. surgery following discharge for an elective evaluation for cholecystectomy.  Type 2 diabetes mellitus, generally uncontrolled. So far, the patient's blood sugars have been below 200 and are trending down.  Malignant hypertension. This has been characteristic of the patient's hospitalization. The pattern has been that her blood pressure decreases following her acute illness. Her blood pressures are now in the 100s systolically. The current doses will be maintained and will make adjustments accordingly.  Chronic sinus tachycardia. She has chronic sinus tachycardia.  Low TSH, borderline high free T4, and borderline low free T3. These indices will need to be followed in the outpatient setting. She may benefit from an outpatient referral to an  endocrinologist. She does not appear to have symptomatology of either hypothyroidism or hyperthyroidism.  History of abscesses of the buttock. CT shows some residual cellulitis.  Hypokalemia. This is being repleted orally.  Mild renal insufficiency, resolved.    Plan:  1. [facility] consult cardiologist Dr. Dietrich Pates. 2. Will restart Lasix at 40 mg daily. 3. Will increase Lantus a little. 4. Disposition will depend upon further workup by cardiology.     LOS: 5 days   Lauren Steele 08/13/2011, 11:05 AM

## 2011-08-13 NOTE — Consult Note (Signed)
CARDIOLOGY CONSULT NOTE  Patient ID: Lauren Steele MRN: [id number] DOB/AGE: 43-Jan-1972 43 y.o.  Admit date: 08/08/2011 Referring Physician: PTH Primary PhysicianLUKING,SCOTT, MD, MD Primary Cardiologist: (New) Kaisen Ackers Reason for Consultation: Systolic Dysfunction, with CHF. Principal Problem:  *Acute systolic CHF (congestive heart failure) Active Problems:  Hypokalemia  Type II diabetes mellitus with complication, uncontrolled  HTN (hypertension)  UTI (lower urinary tract infection)  Abdominal pain  Biliary dyskinesia  Ovarian cyst  Cardiomyopathy  Abnormal thyroid function test  HPI: Lauren Steele is a 41 yo patient without prior cardiac history, admitted with abdominal pain, nausea and vomiting.CXR demonstrated mild congestive heart failure with cardiomegaly and mild interstitial pulmonary edema  BNP 5480. Marland KitchenShe has been treated with IV lasix and now transitioned to PO lasix 40 mg daily with a 5 lb weight loss.Echocardiogram competed yesterday and read by Dr. Diona Browner demonstrated severe systolic dysfunction.with EF of 20% to 25%. There is hypokinesis to akinesis of the anteroseptal and apical myocardium.  We are asked for further recommendations and management of systolic CHF.    She has a prior history of Type II diabetes, hypertension, and biliary dyskinesia. Hypertension has been ongoing for at least 5 years. She has a history of medical noncompliance with hypertensive medications. She was taking diltiazem but this caused edema and shortness of breath. She had NYHA Class III symptoms. She was changed to HCTZ just prior to this admission with some resolution of symptoms. TSH low at 0.242 with Free T4 1.84 and Free T3 of 2.2. TC 198, TG 115, HDL 78 LDL 97.   Review of systems complete and found to be negative unless listed above   Past Medical History  Diagnosis Date  . Hypertension   . Diabetes mellitus   . 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 Noncompliance   . MRSA  infection 06/02/2011  . Cardiomyopathy 08/13/2011    Ejection fraction 15-20%.  . Acute systolic CHF (congestive heart failure) 08/08/2011    New diagnosis.  . Abnormal thyroid function test 08/13/2011    Slightly low TSH, slightly low free T3, and slightly elevated free T4.    Family History  Problem Relation Age of Onset  . Diabetes Mother   . Diabetes Father   . Kidney disease Mother   . Kidney disease Father   . Stroke Father   . Coronary artery disease Father     CABG  . Coronary artery disease Mother     History   Social History  . Marital Status: Married    Spouse Name: N/A    Number of Children: N/A  . Years of Education: N/A   Occupational History  . Not on file.   Social History Main Topics  . Smoking status: Former Smoker    Quit date: 04/18/2011  . Smokeless tobacco: Not on file  . Alcohol Use: No  . Drug Use: No  . Sexually Active: Yes    Birth Control/ Protection: None   Other Topics Concern  . Not on file   Social History Narrative  . No narrative on file    History reviewed. No pertinent past surgical history.   Prescriptions prior to admission  Medication Sig Dispense Refill  . hydrochlorothiazide (HYDRODIURIL) 25 MG tablet Take 25 mg by mouth daily.      . insulin aspart (NOVOLOG) 100 UNIT/ML injection Inject 0-15 Units into the skin 3 (three) times daily with meals. FOR BLOOD GLUCOSE BETWEEN 70-120 NO INSULIN. BLOOD GLUCOSE 121-150 TAKE 2 UNITS.  BLOOD GLUCOSE 151-200 TAKE 3 UNITS.    BLOOD GLUCOSE 201-250 TAKE 5 UNITS.            BLOOD GLUCOSE 251-300 TAKE 8 UNITS BLOOD GLUCOSE 301-350 TAKE 11 UNITS.           BLOOD GLUCOSE 351-400 TAKE 15 UNITS.  1 vial  2  . insulin glargine (LANTUS) 100 UNIT/ML injection Inject 20 Units into the skin at bedtime.  10 mL  2  . lisinopril (PRINIVIL,ZESTRIL) 10 MG tablet Take 1 tablet (10 mg total) by mouth daily. FOR HYPERTENSION.  30 tablet  2  . omeprazole (PRILOSEC) 20 MG capsule Take 20 mg by mouth  daily.       Physical Exam: Blood pressure 103/77, pulse 122, temperature 99.1 F (37.3 C), temperature source Oral, resp. rate 17, height 5\' 7"  (1.702 m), weight 175 lb 4.3 oz (79.5 kg), last menstrual period 08/12/2011, SpO2 99.00%.  General: Well developed, well nourished, in no acute distress Head: Eyes PERRLA, No xanthomas.   Normal cephalic and atramatic  Lungs: Clear bilaterally to auscultation and percussion. Heart: HRRR S1 S2,tachycardic without MRG.  Pulses are 2+ & equal.            No carotid bruit. No JVD.  No abdominal bruits. No femoral bruits. Abdomen: Bowel sounds are positive, abdomen soft and non-tender without masses or                  Hernia's noted. Msk:  Back normal, normal gait. Normal strength and tone for age. Extremities: No clubbing, cyanosis or edema.  DP +1 Neuro: Alert and oriented X 3. Psych:  Good affect, responds appropriately   Lab Results  Component Value Date   WBC 5.4 08/13/2011   HGB 14.0 08/13/2011   HCT 42.9 08/13/2011   MCV 87.7 08/13/2011   PLT 428* 08/13/2011     Lab 08/13/11 0536 08/08/11 2118  NA 139 --  K 4.0 --  CL 102 --  CO2 27 --  BUN 26* --  CREATININE 0.96 --  CALCIUM 9.3 --  PROT -- 7.8  BILITOT -- 0.8  ALKPHOS -- 98  ALT -- 44*  AST -- 20  GLUCOSE 106* --    Echocardiogram: 5./28/2013  Left ventricle: Mild LVH.  The estimated ejection fraction was in the range of 20% to 25%. There is hypokinesis to akinesis of the anteroseptal and apical myocardium. - Right ventricle: Systolic function was mildly to moderately reduced.  No significant valvular abnormalities  Radiology: Dg Chest 2 View 08/12/2011  The heart is mildly enlarged.  Mild lower lobe atelectasis versus air space disease.  This could represent mild edema.    ZOX:WRUEA bradycardia with left axis deviation; QT prolongation; mildly delayed R-wave progression  ASSESSMENT AND PLAN:  1. Severe Systolic Dysfunction with Mixed CHF: She has diuresed 5 lbs with  almost complete resolution of symptoms. Renal status is stable. She has multiple CVRF for CAD to include, hypertension, diabetes, FH, smoking, hyperthyroid. . Will change metprolol to coreg 6. 25 mg BID with up titration as BP tolerates. Add spironolactone. Continue ACE inhibitor (lisinopril 10 mg). Will need more testing for etiology of systolic dysfunction with either cardiac cath vs stress test to evaluate for ischemia with multiple CVRF's. Continue lasix. Creatinine today is 0.96.   2. Hypertension: History of medical noncompliance. Now better controlled on current medications. Echo has evidence of grade II diastolic dysfunction. She will need close follow-up for continued management of BP. Low  sodium diet. Continue daily wts and strict I/O.   Bettey Mare. Lyman Bishop NP [facility] 08/13/2011, 3:27 PM  Cardiology Attending Patient interviewed and examined. Discussed with Joni Reining, NP.  Above note annotated and modified based upon my findings.  Patient has a history of exercise intolerance, but denies dyspnea on exertion until a recent course of therapy with diltiazem resulted in frank congestive heart failure. She is now found to have severely depressed left ventricular systolic function without an obvious etiology. Thyroid function studies are modestly abnormal, and she has a persistent sinus tachycardia, but the magnitude of abnormality does not appear adequate to explain her left ventricular dysfunction. She denies risk factors for HIV, excessive alcohol use, family history for cardiomyopathy or recent noncardiac illness other than the GI problems with which he presented.  There is no EKG evidence for ischemic heart disease.  She has responded well to initial medical management and can be discharged. I have discussed outpatient cardiac catheterization with her with the name of demonstrating adequate medical therapy and excluding coronary artery disease. We will follow her closely and plan  a return office visit in one week.  Alpha Bing, MD 08/13/2011, 5:09 PM

## 2011-08-14 ENCOUNTER — Encounter (HOSPITAL_COMMUNITY): Payer: Self-pay | Admitting: Internal Medicine

## 2011-08-14 NOTE — Discharge Summary (Signed)
Physician Discharge Summary  SONAKSHI ROLLAND MRN: [id number] DOB/AGE: 06/30/1970 41 y.o.  PCP: Lilyan Punt, MD, MD   Admit date: 08/08/2011 Discharge date: 08/14/2011  Discharge Diagnoses:  1. Newly diagnosed acute congestive heart failure with evidence of grade 2 diastolic dysfunction. Ejection fraction 20-25%, per 2-D echocardiogram. 2. Chronic sinus tachycardia. 3. Abdominal pain, nausea, and transient vomiting. Possibly secondary to urinary tract infection versus new onset congestive heart failure versus biliary dyskinesia. 4. Known biliary dyskinesia, per nuclear medicine study 03/2010 with gallbladder ejection fraction of 22%. Patient failed outpatient followup with general surgery. 5. Type 2 diabetes mellitus, historically uncontrolled. Hemoglobin A1c 8.3. 6. Abnormal thyroid function tests. The patient's TSH was modestly low at 0.24; her free T4 was borderline elevated at 1.84; and her free T3 was borderline low at 2.2. THE PATIENT WILL NEED TO HAVE HER THYROID FUNCTION REASSESSED IN 2-3 MONTHS. CONSIDER OUTPATIENT ENDOCRINOLOGY CONSULTATION. 7. Labile hypertension. 8. Urinary tract infection. 9. Hypokalemia. 10. Bilateral ovarian cysts with complex ovarian cyst on the right. THE PATIENT WILL NEED A FOLLOWUP PELVIC ULTRASOUND IN 2-3 MONTHS. 11. Normal fasting lipid profile with a total cholesterol of 198, triglycerides 1:15, HDL cholesterol 78, and LDL cholesterol 97. 11. History of small buttock abscesses, secondary to MRSA in March 2013.    Medication List  As of 08/14/2011  2:09 PM   STOP taking these medications         hydrochlorothiazide 25 MG tablet         TAKE these medications         aspirin 81 MG EC tablet   Take 1 tablet (81 mg total) by mouth daily.      carvedilol 6.25 MG tablet   Commonly known as: COREG   Take 1 tablet (6.25 mg total) by mouth 2 (two) times daily with a meal. For your blood pressure and heart.      furosemide 40 MG tablet   Commonly known as: LASIX   Take 1 tablet (40 mg total) by mouth daily. For treatment of your heart.      insulin aspart 100 UNIT/ML injection   Commonly known as: novoLOG   Inject 0-15 Units into the skin 3 (three) times daily with meals. FOR BLOOD GLUCOSE BETWEEN 70-120 NO INSULIN.  BLOOD GLUCOSE 121-150 TAKE 2 UNITS.            BLOOD GLUCOSE 151-200 TAKE 3 UNITS.     BLOOD GLUCOSE 201-250 TAKE 5 UNITS.            BLOOD GLUCOSE 251-300 TAKE 8 UNITS  BLOOD GLUCOSE 301-350 TAKE 11 UNITS.           BLOOD GLUCOSE 351-400 TAKE 15 UNITS.      insulin glargine 100 UNIT/ML injection   Commonly known as: LANTUS   Inject 20 Units into the skin at bedtime.      lisinopril 10 MG tablet   Commonly known as: PRINIVIL,ZESTRIL   Take 1 tablet (10 mg total) by mouth daily. FOR HYPERTENSION.      omeprazole 20 MG capsule   Commonly known as: PRILOSEC   Take 20 mg by mouth daily.      potassium chloride 10 MEQ tablet   Commonly known as: K-DUR   Take 1 tablet (10 mEq total) by mouth 2 (two) times daily.      promethazine 12.5 MG tablet   Commonly known as: PHENERGAN   Take 1 tablet (12.5 mg total) by mouth every 6 (six)  hours as needed for nausea.      spironolactone 12.5 mg Tabs   Commonly known as: ALDACTONE   Take 0.5 tablets (12.5 mg total) by mouth 2 (two) times daily. For your heart.            Discharge Condition: Improved.  Disposition: 01-Home or Self Care   Consults: Gorman Bing, M.D.   Significant Diagnostic Studies: Ct Abdomen Pelvis Wo Contrast  08/08/2011  *RADIOLOGY REPORT*  Clinical Data: Abdominal pain with nausea and vomiting.  CT ABDOMEN AND PELVIS WITHOUT CONTRAST  Technique:  Multidetector CT imaging of the abdomen and pelvis was performed following the standard protocol without intravenous contrast.  Comparison: Radiographs dated 08/08/2011 and CT scan of the abdomen and pelvis dated 11/09/2009  Findings: There is a complex 4 cm cystic lesion on the right  ovary which is new since the prior study.  There is a 2.5 cm cyst on the left ovary.  This is also new.  Uterus is normal.  No free air or free fluid in the abdomen.  Scattered diverticula in the left side of the colon.  No acute diverticulitis.  Terminal ileum and appendix are normal.  Liver, spleen, pancreas, adrenal glands, and kidneys are normal. Gallbladder appears normal.  There is appreciable increasing cardiomegaly since the prior study. No effusions.  No acute osseous abnormality.  There appear to be some inflammatory changes of the top of the buttock crease with edema in the soft tissues at that area, new since the prior study.  IMPRESSION:  1.  Bilateral ovarian cysts.  The cyst on the right is complex.  I recommend pelvic follow-up ultrasound in 2 months. 2.  Increased cardiomegaly. 3.  Possible cellulitis at the top of the buttock crease.  Original Report Authenticated By: Gwynn Burly, M.D.   Dg Chest 2 View  08/12/2011  *RADIOLOGY REPORT*  Clinical Data: Pulmonary edema  CHEST - 2 VIEW  Comparison: Chest radiograph 10/17/2009  Findings: Normal mediastinum and heart silhouette.  The heart is mildly enlarged which is similar to prior. There is mild air space disease in the lower lobes versus atelectasis.  IMPRESSION: Mild lower lobe atelectasis versus air space disease.  This could represent mild edema.  Original Report Authenticated By: Genevive Bi, M.D.   Dg Abd Acute W/chest  08/08/2011  *RADIOLOGY REPORT*  Clinical Data: Abdominal pain with nausea, vomiting, and diarrhea.  ACUTE ABDOMEN SERIES (ABDOMEN 2 VIEW & CHEST 1 VIEW)  Comparison: Chest x-ray dated 11/13/2009 and a CT scan of the abdomen dated 11/09/2009  Findings: There is increased cardiomegaly with slight bilateral interstitial pulmonary edema and slight pulmonary vascular prominence.  There is no free air in the abdomen.  There is minimal air within the nondistended bowel.  Benign-appearing calcifications in the pelvis.  No  osseous abnormality.  IMPRESSION: Findings are consistent with mild congestive heart failure with cardiomegaly and mild interstitial pulmonary edema.  Benign- appearing abdomen.  Original Report Authenticated By: Gwynn Burly, M.D.   Echocardiogram: 5./28/2013  Study Conclusions  - Left ventricle: The cavity size was normal. Wall thickness was increased in a pattern of mild LVH. Systolic function was severely reduced. The estimated ejection fraction was in the range of 20% to 25%. There is hypokinesis to akinesis of the anteroseptal and apical myocardium. Preserved contraction in mid to basal lateral wall. Features are consistent with a pseudonormal left ventricular filling pattern, with concomitant abnormal relaxation and increased filling pressure (grade 2 diastolic dysfunction). - Aortic valve:  Probably trileaflet. - Mitral valve: Trivial regurgitation. - Left atrium: The atrium was mildly dilated. - Right ventricle: Systolic function was mildly to moderately reduced. - Tricuspid valve: Trivial regurgitation. - Pericardium, extracardiac: There was no pericardial effusion. Transthoracic echocardiography. M-mode, complete 2D, spectral Doppler, and color Doppler. Height: Height: 170.2cm. Height: 67in. Weight: Weight: 80.3kg. Weight: 176.6lb. Body mass index: BMI: 27.7kg/m^2. Body surface area: BSA: 1.60m^2. Patient status: Inpatient.      Microbiology: Recent Results (from the past 240 hour(s))  URINE CULTURE     Status: Normal   Collection Time   08/08/11  3:52 PM      Component Value Range Status Comment   Specimen Description URINE, CLEAN CATCH   Final    Special Requests NONE   Final    Culture  Setup Time [id number]   Final    Colony Count 75,000 COLONIES/ML   Final    Culture     Final    Value: LACTOBACILLUS SPECIES     Note: Standardized susceptibility testing for this organism is not available.   Report Status 08/10/2011 FINAL   Final   MRSA PCR SCREENING      Status: Abnormal   Collection Time   08/12/11  9:09 AM      Component Value Range Status Comment   MRSA by PCR POSITIVE (*) NEGATIVE  Final      Labs: Results for orders placed during the hospital encounter of 08/08/11 (from the past 48 hour(s))  GLUCOSE, CAPILLARY     Status: Abnormal   Collection Time   08/12/11  4:23 PM      Component Value Range Comment   Glucose-Capillary 155 (*) 70 - 99 (mg/dL)   GLUCOSE, CAPILLARY     Status: Abnormal   Collection Time   08/12/11  8:57 PM      Component Value Range Comment   Glucose-Capillary 108 (*) 70 - 99 (mg/dL)    Comment 1 Notify RN     BASIC METABOLIC PANEL     Status: Abnormal   Collection Time   08/13/11  5:36 AM      Component Value Range Comment   Sodium 139  135 - 145 (mEq/L)    Potassium 4.0  3.5 - 5.1 (mEq/L) DELTA CHECK NOTED   Chloride 102  96 - 112 (mEq/L)    CO2 27  19 - 32 (mEq/L)    Glucose, Bld 106 (*) 70 - 99 (mg/dL)    BUN 26 (*) 6 - 23 (mg/dL)    Creatinine, Ser 9.56  0.50 - 1.10 (mg/dL)    Calcium 9.3  8.4 - 10.5 (mg/dL)    GFR calc non Af Amer 72 (*) >90 (mL/min)    GFR calc Af Amer 83 (*) >90 (mL/min)   CBC     Status: Abnormal   Collection Time   08/13/11  5:36 AM      Component Value Range Comment   WBC 5.4  4.0 - 10.5 (K/uL)    RBC 4.89  3.87 - 5.11 (MIL/uL)    Hemoglobin 14.0  12.0 - 15.0 (g/dL)    HCT 21.3  08.6 - 57.8 (%)    MCV 87.7  78.0 - 100.0 (fL)    MCH 28.6  26.0 - 34.0 (pg)    MCHC 32.6  30.0 - 36.0 (g/dL)    RDW 46.9  62.9 - 52.8 (%)    Platelets 428 (*) 150 - 400 (K/uL)   GLUCOSE, CAPILLARY     Status:  Abnormal   Collection Time   08/13/11  7:35 AM      Component Value Range Comment   Glucose-Capillary 105 (*) 70 - 99 (mg/dL)   GLUCOSE, CAPILLARY     Status: Abnormal   Collection Time   08/13/11 10:58 AM      Component Value Range Comment   Glucose-Capillary 198 (*) 70 - 99 (mg/dL)    Comment 1 Notify RN     GLUCOSE, CAPILLARY     Status: Abnormal   Collection Time   08/13/11   4:51 PM      Component Value Range Comment   Glucose-Capillary 183 (*) 70 - 99 (mg/dL)    Comment 1 Notify RN      Comment 2 Documented in Chart        HPI : The patient is a 41 year old woman with a history significant for type 2 diabetes mellitus, biliary dyskinesia, and hypertension, who presented to the emergency department on 08/08/2011 with a chief complaint of abnormal pain with associated nausea and vomiting. In the emergency department, she was noted to be hypertensive with a blood pressure 186/126, tachycardic with a pulse rate of 115, and afebrile. Her urine pregnancy test was negative. Her EKG revealed sinus tachycardia with PVCs and a heart rate of 107 beats per minute. Her lab data were significant for a normal serum sodium of 141, potassium of 3.4, CO2 of 24, glucose of 199, and creatinine of 0.66. Her lipase was within normal limits at 29. Her liver transaminases were within normal limits with an exception of an ALT of 46 and total bilirubin of 1.4. Her troponin I was less than 0.30. Her pro BNP was 5480. Her chest x-ray revealed mild pulmonary edema. Was admitted for further evaluation and management.  HOSPITAL COURSE: 1. Newly diagnosed acute systolic congestive heart failure with grade 2 diastolic dysfunction. The patient had no prior history of congestive heart failure. Following her discharge for treatment of buttock abscesses in March 2013, she developed dyspnea and swelling in her legs bilaterally. This was attributed to diltiazem. Her primary care physician discontinued it. She was subsequently started on hydrochlorothiazide. The hydrochlorothiazide decreased her lower extremity edema. During this hospitalization, she was started on treatment with IV Lasix after she was given one 40 mg dose in the emergency department. Lisinopril was continued. Low dose metoprolol was started. For further evaluation, a number of studies were ordered. Her cardiac enzymes were within normal limits x3.  Her fasting lipid profile was within normal limits. Her thyroid function tests were borderline abnormal with a slightly low TSH, slightly elevated free T4, and slightly low free T3. No pharmacological therapy was initiated, however, she will need to have her thyroid function tests reassessed in the outpatient setting. Her blood magnesium level was within normal limits at 1.7. The 2-D echocardiogram, which was delayed because of the holiday, eventually revealed an ejection fraction of 20-25% and grade 2 diastolic dysfunction. Prior to the revelation of systolic and diastolic dysfunction, Lasix was temporarily discontinued when it appeared that the patient became euvolemic and developed mild acute renal insufficiency. Following the results, Lasix was restarted and cardiologist Dr. Dietrich Pates was consulted. Metoprolol was discontinued in favor of carvedilol. Aldactone was added. Further investigation of her cardiomyopathy was deferred to the outpatient setting which would include a possible cardiac catheterization or a stress test. She did diuresed briskly during the first couple days of the hospitalization. Her weight which was 81.6 kg on admission was 79.5 kg at the  time of discharge. Her followup chest x-ray revealed mild lower lobe atelectasis versus airspace disease. There was no florid edema.  2. Labile hypertension and tachycardia. Historically, the patient's blood pressures have been malignant during the first couple days of the hospitalization, owing to a combination of noncompliance and acute illnesses or pain. She has a history of chronic tachycardia. Her TSH in the past has been within normal limits. During this hospitalization, she was restarted on lisinopril. Metoprolol was started as noted above. Both lisinopril and metoprolol were titrated up significantly due to systolic blood pressures in the 180s during the first couple days of hospitalization. However, like usual, her blood pressure improved  significantly. Lisinopril was decreased back to her prehospital dose of 10 mg daily. Metoprolol was discontinued in favor of carvedilol by cardiology. Hydrochlorothiazide was discontinued because of the addition of Lasix. Her blood pressure was in the lower 100s systolically prior to discharge. Her thyroid tests were borderline abnormal, but the mild abnormalities, was not believed to be contributing to her sinus tachycardia or her new onset cardiomyopathy. Nevertheless, her thyroid function will need to be reassessed in the outpatient setting and perhaps a referral to endocrinology would be helpful.  3. Abdominal pain, nausea, transient vomiting. UTI. History of biliary dyskinesia. The patient has a history of presentation with nausea, vomiting, and abdominal pain. In the past, her symptoms were attributable to infection, DKA, or biliary dyskinesia. Her urinalysis was notably infected appearing. She was started on Rocephin. CT scan of her abdomen and pelvis revealed no acute findings with exception of bilateral ovarian cysts. Her pain was treated with as needed IV analgesics and her nausea and vomiting were treated with IV Phenergan. She had an abnormal HIDA scan in January 2012. She was referred to general surgery for outpatient assessment, but was lost to followup. She could not complete gastric emptying study on several occasions during previous hospitalizations, and therefore, it is possible that she may have diabetic gastroparesis. With supportive treatment, her symptoms abated. Her urine culture grew out lactobacillus species. Rocephin was discontinued upon discharge after 5 days of treatment. She was referred again for surgical outpatient evaluation for a possible cholecystectomy.  4. Bilateral ovarian cysts.  Bilateral ovarian cysts with a right complex ovarian cyst, was incidentally found on the CT scan of the abdomen and pelvis ordered. The patient was informed of this finding. She will need a  pelvic ultrasound in 2-3 months for further evaluation. This will be deferred to her primary care physician.  5. Type II, insulin requiring diabetes mellitus. Historically, her diabetes has been uncontrolled due to noncompliance. During this hospitalization, was under relatively good control. She was continued on treatment with sliding-scale NovoLog and Lantus. Her hemoglobin A1c was 8.3.  6. Hypokalemia. With Lasix therapy, her serum potassium decreased. She was supplemented appropriately. Her magnesium level was within normal limits.    Discharge Exam: Blood pressure 103/77, pulse 122, temperature 99.1 F (37.3 C), temperature source Oral, resp. rate 17, height 5\' 7"  (1.702 m), weight 79.5 kg (175 lb 4.3 oz), last menstrual period 08/12/2011, SpO2 99.00%.  Lungs: Decreased breath sounds at bases, otherwise clear. No wheezes or crackles. Heart: S1, S2, with a questionable ectopic S3. Abdomen: Positive bowel sounds, soft, nontender, nondistended. Extremities: No pedal edema.  Discharge time: 30 minutes.   Discharge Orders    Future Orders Please Complete By Expires   Diet - low sodium heart healthy      Diet Carb Modified  Increase activity slowly      Discharge instructions      Comments:   You have been started on several new medications for your heart.      Follow-up Information    Follow up with Dr Lovell Sheehan & Leticia Penna June 4th at 2:15 pm. (For evaluation for gallbladder surgery.)       Follow up with Beltrami Bing, MD. Schedule an appointment as soon as possible for a visit in 1 week. (For cardiology followup.)    Contact information:   [address] [place] [address] [phone]       Follow up with Lilyan Punt, MD. Schedule an appointment as soon as possible for a visit in 2 weeks.   Contact information:   [address] [place] [address] [phone]          Signed: Enoch Moffa 08/14/2011, 2:09 PM

## 2011-08-18 NOTE — Care Management Note (Signed)
    Page 1 of 1   08/18/2011     3:48:16 PM   CARE MANAGEMENT NOTE 08/18/2011  Patient:  Lauren Steele, Lauren Steele   Account Number:  [id number]  Date Initiated:  08/13/2011  Documentation initiated by:  Anibal Henderson  Subjective/Objective Assessment:   Pt admitted  with  CHF, dyspnea, nausea and vomiting. Pt has known biliary dyskinesia. She lives at home with spouse, is independent and will return home at D/C.     Action/Plan:   Pt does not feel she needs HH at D/C. Appt made for her to follop up with surgeon for  cholecystitis, and she agrees she will do this. States she is tires of the nausea.   Anticipated DC Date:  08/13/2011   Anticipated DC Plan:  HOME/SELF CARE      DC Planning Services  CM consult      Choice offered to / List presented to:             Status of service:  Completed, signed off Medicare Important Message given?   (If response is "NO", the following Medicare IM given date fields will be blank) Date Medicare IM given:   Date Additional Medicare IM given:    Discharge Disposition:    Per UR Regulation:  Reviewed for med. necessity/level of care/duration of stay  If discussed at Long Length of Stay Meetings, dates discussed:    Comments:  08/18/11 1540 Delayed entry- Pt D/C home with instructions to follow up with OP PCP, surgeon and cardiologist. Anibal Henderson RN

## 2011-09-19 ENCOUNTER — Other Ambulatory Visit: Payer: Self-pay | Admitting: Internal Medicine

## 2011-11-20 ENCOUNTER — Other Ambulatory Visit: Payer: Self-pay | Admitting: Internal Medicine

## 2012-06-07 ENCOUNTER — Encounter: Payer: Self-pay | Admitting: *Deleted

## 2012-06-07 ENCOUNTER — Other Ambulatory Visit: Payer: Self-pay | Admitting: *Deleted

## 2012-06-07 MED ORDER — CARVEDILOL 6.25 MG PO TABS: 6.2500 mg | ORAL_TABLET | Freq: Two times a day (BID) | ORAL | Status: DC

## 2012-06-15 ENCOUNTER — Other Ambulatory Visit: Payer: Self-pay | Admitting: *Deleted

## 2012-06-15 MED ORDER — INSULIN GLARGINE 100 UNIT/ML ~~LOC~~ SOLN: 20.0000 [IU] | Freq: Every day | SUBCUTANEOUS | Status: DC

## 2012-06-30 ENCOUNTER — Ambulatory Visit: Payer: BC Managed Care – PPO | Admitting: Family Medicine

## 2012-07-05 ENCOUNTER — Ambulatory Visit: Payer: BC Managed Care – PPO | Admitting: Family Medicine

## 2014-09-10 ENCOUNTER — Encounter (HOSPITAL_COMMUNITY): Payer: Self-pay | Admitting: Emergency Medicine

## 2014-09-10 ENCOUNTER — Inpatient Hospital Stay (HOSPITAL_COMMUNITY)
Admission: EM | Admit: 2014-09-10 | Discharge: 2014-09-13 | DRG: 637 | Disposition: A | Discharge status: Home / Self Care | Payer: BLUE CROSS/BLUE SHIELD | Attending: Internal Medicine | Admitting: Internal Medicine

## 2014-09-10 ENCOUNTER — Emergency Department (HOSPITAL_COMMUNITY): Payer: BLUE CROSS/BLUE SHIELD

## 2014-09-10 DIAGNOSIS — I5042 Chronic combined systolic (congestive) and diastolic (congestive) heart failure: Secondary | ICD-10-CM | POA: Diagnosis present

## 2014-09-10 DIAGNOSIS — E118 Type 2 diabetes mellitus with unspecified complications: Secondary | ICD-10-CM

## 2014-09-10 DIAGNOSIS — R4182 Altered mental status, unspecified: Secondary | ICD-10-CM | POA: Diagnosis not present

## 2014-09-10 DIAGNOSIS — Z823 Family history of stroke: Secondary | ICD-10-CM

## 2014-09-10 DIAGNOSIS — R451 Restlessness and agitation: Secondary | ICD-10-CM | POA: Diagnosis present

## 2014-09-10 DIAGNOSIS — E1165 Type 2 diabetes mellitus with hyperglycemia: Secondary | ICD-10-CM | POA: Diagnosis present

## 2014-09-10 DIAGNOSIS — G9340 Encephalopathy, unspecified: Secondary | ICD-10-CM | POA: Diagnosis present

## 2014-09-10 DIAGNOSIS — Z87891 Personal history of nicotine dependence: Secondary | ICD-10-CM

## 2014-09-10 DIAGNOSIS — Z8249 Family history of ischemic heart disease and other diseases of the circulatory system: Secondary | ICD-10-CM

## 2014-09-10 DIAGNOSIS — E1110 Type 2 diabetes mellitus with ketoacidosis without coma: Secondary | ICD-10-CM

## 2014-09-10 DIAGNOSIS — Z794 Long term (current) use of insulin: Secondary | ICD-10-CM

## 2014-09-10 DIAGNOSIS — E1100 Type 2 diabetes mellitus with hyperosmolarity without nonketotic hyperglycemic-hyperosmolar coma (NKHHC): Secondary | ICD-10-CM | POA: Diagnosis present

## 2014-09-10 DIAGNOSIS — E1310 Other specified diabetes mellitus with ketoacidosis without coma: Principal | ICD-10-CM | POA: Diagnosis present

## 2014-09-10 DIAGNOSIS — E876 Hypokalemia: Secondary | ICD-10-CM | POA: Diagnosis present

## 2014-09-10 DIAGNOSIS — R569 Unspecified convulsions: Secondary | ICD-10-CM

## 2014-09-10 DIAGNOSIS — Z833 Family history of diabetes mellitus: Secondary | ICD-10-CM

## 2014-09-10 DIAGNOSIS — I10 Essential (primary) hypertension: Secondary | ICD-10-CM | POA: Diagnosis present

## 2014-09-10 DIAGNOSIS — I429 Cardiomyopathy, unspecified: Secondary | ICD-10-CM | POA: Diagnosis present

## 2014-09-10 DIAGNOSIS — E870 Hyperosmolality and hypernatremia: Secondary | ICD-10-CM | POA: Diagnosis present

## 2014-09-10 DIAGNOSIS — Z9114 Patient's other noncompliance with medication regimen: Secondary | ICD-10-CM | POA: Diagnosis present

## 2014-09-10 DIAGNOSIS — IMO0002 Reserved for concepts with insufficient information to code with codable children: Secondary | ICD-10-CM | POA: Diagnosis present

## 2014-09-10 DIAGNOSIS — N179 Acute kidney failure, unspecified: Secondary | ICD-10-CM

## 2014-09-10 LAB — CBC
HCT: 38.3 % (ref 36.0–46.0)
Hemoglobin: 12.6 g/dL (ref 12.0–15.0)
MCH: 28.7 pg (ref 26.0–34.0)
MCHC: 32.9 g/dL (ref 30.0–36.0)
MCV: 87.2 fL (ref 78.0–100.0)
PLATELETS: 393 10*3/uL (ref 150–400)
RBC: 4.39 MIL/uL (ref 3.87–5.11)
RDW: 13.1 % (ref 11.5–15.5)
WBC: 7.7 10*3/uL (ref 4.0–10.5)

## 2014-09-10 LAB — I-STAT VENOUS BLOOD GAS, ED
ACID-BASE DEFICIT: 2 mmol/L (ref 0.0–2.0)
BICARBONATE: 23 meq/L (ref 20.0–24.0)
O2 Saturation: 91 %
Patient temperature: 98.8
TCO2: 24 mmol/L (ref 0–100)
pCO2, Ven: 39.3 mmHg — ABNORMAL LOW (ref 45.0–50.0)
pH, Ven: 7.376 — ABNORMAL HIGH (ref 7.250–7.300)
pO2, Ven: 64 mmHg — ABNORMAL HIGH (ref 30.0–45.0)

## 2014-09-10 LAB — I-STAT CG4 LACTIC ACID, ED: Lactic Acid, Venous: 4.48 mmol/L (ref 0.5–2.0)

## 2014-09-10 LAB — CBG MONITORING, ED: Glucose-Capillary: >600 mg/dL (ref 65–99)

## 2014-09-10 MED ORDER — HALOPERIDOL LACTATE 5 MG/ML IJ SOLN
10.0000 mg | Freq: Once | INTRAMUSCULAR | Status: AC
  Administered 2014-09-10: 10 mg via INTRAMUSCULAR
  Filled 2014-09-10: qty 2

## 2014-09-10 MED ORDER — LORAZEPAM 2 MG/ML IJ SOLN
INTRAMUSCULAR | Status: AC
  Filled 2014-09-10: qty 1

## 2014-09-10 MED ORDER — SODIUM CHLORIDE 0.9 % IV BOLUS (SEPSIS)
1000.0000 mL | Freq: Once | INTRAVENOUS | Status: AC
  Administered 2014-09-10: 1000 mL via INTRAVENOUS

## 2014-09-10 MED ORDER — LORAZEPAM 2 MG/ML IJ SOLN
2.0000 mg | Freq: Once | INTRAMUSCULAR | Status: AC
  Administered 2014-09-10: 2 mg via INTRAVENOUS

## 2014-09-10 MED ORDER — LORAZEPAM 2 MG/ML IJ SOLN
INTRAMUSCULAR | Status: AC
  Administered 2014-09-10: 2 mg via INTRAVENOUS
  Filled 2014-09-10: qty 1

## 2014-09-10 MED ORDER — LORAZEPAM 2 MG/ML IJ SOLN
2.0000 mg | Freq: Once | INTRAMUSCULAR | Status: AC
  Administered 2014-09-10: 2 mg via INTRAVENOUS
  Filled 2014-09-10: qty 1

## 2014-09-10 MED ORDER — DIPHENHYDRAMINE HCL 50 MG/ML IJ SOLN
50.0000 mg | Freq: Once | INTRAMUSCULAR | Status: DC
  Filled 2014-09-10: qty 1

## 2014-09-10 NOTE — ED Notes (Signed)
Pt arrives via [facility] for hypertension and high blood sugar. Pt was post ictal when ems arrived, pts neighbor stated that he witness patient having seizure that lasted 7-8 minutes, no hx of seizures. Pt reports she has not been on her meds for a few weeks. EMS blood glucose reading was "HIGH".

## 2014-09-10 NOTE — ED Notes (Signed)
Dr Gwendolyn Grant given a copy of lactic acid 4.48

## 2014-09-10 NOTE — ED Notes (Addendum)
Patient had  seizure while MD was at bedside. Md came to the nurse station and stated   Patient needs 1 mg of ativan IV. This nurse gave patient 1 mg of Ativan IV.  Patient seizure lasted 30 seconds. Patient then was suctioned by EMT Anette Riedel. Patient then became combative MD verbal order 1mg  Ativan.  Mg of Ativan given by [facility]. Patient continued to be combative attempted to bite staff and climb out of bed. Patient given another 2 mg of Ativan. Patient still continued to be combative and patient was given an additional 2 mg ativan and 10 mg of Haldol. Patient Vitals continued to remain stable during the process. Patient's family at bedside during the process. Patient now sleep. This nurse at bedside with patient to monitor patient.

## 2014-09-10 NOTE — ED Provider Notes (Signed)
CSN: [id number]     Arrival date & time    History   First MD Initiated Contact with Patient 09/10/14 2150     Chief Complaint  Patient presents with  . Seizures  . Hyperglycemia     (Consider location/radiation/quality/duration/timing/severity/associated sxs/prior Treatment) Patient is a 44 y.o. female presenting with seizures and hyperglycemia. The history is provided by the EMS personnel and the patient. No language interpreter was used.  Seizures Seizure activity on arrival: no   Seizure type:  Grand mal Preceding symptoms: no sensation of an aura present, no dizziness, no euphoria, no headache, no hyperventilation, no nausea, no numbness, no panic and no vision change   Initial focality:  None Episode characteristics: generalized shaking   Postictal symptoms: confusion, memory loss and somnolence   Return to baseline: yes   Severity:  Severe Duration:  8 minutes Timing:  Once Number of seizures this episode:  1 Progression:  Resolved Context: medical non-compliance   Context: not alcohol withdrawal, not fever, not hydrocephalus and not pregnant   Context comment:  Hyperglycemia Recent head injury:  No recent head injuries PTA treatment:  None Hyperglycemia Blood sugar level PTA:  "HIGH" Severity:  Severe Onset quality:  Gradual Duration:  3 weeks Timing:  Constant Progression:  Unchanged Chronicity:  Recurrent Diabetes status:  Controlled with insulin Current diabetic therapy:  Nothing Time since last antidiabetic medication:  3 weeks Context: noncompliance   Relieved by:  Nothing Ineffective treatments:  None tried Associated symptoms: no abdominal pain, no chest pain, no dizziness, no dysuria, no fatigue, no fever, no nausea, no shortness of breath, no vomiting and no weakness   Risk factors: hx of DKA   Risk factors: no family hx of diabetes, no obesity, no pancreatic disease, no pregnancy and no recent steroid use     Past Medical History  Diagnosis Date   . Hypertension   . Diabetes mellitus   . 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 Noncompliance   . MRSA infection 06/02/2011  . Cardiomyopathy 08/12/2011    Ejection fraction 20-25%.  . Acute systolic CHF (congestive heart failure) 08/08/2011    New diagnosis.  . Abnormal thyroid function test 08/13/2011    Slightly low TSH, slightly low free T3, and slightly elevated free T4.  . 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    Grade 2  . Biliary dyskinesia 03/2010.    Gallbladder ejection fraction 22%.   History reviewed. No pertinent past surgical history. Family History  Problem Relation Age of Onset  . Diabetes Mother   . Diabetes Father   . Kidney disease Mother   . Kidney disease Father   . Stroke Father   . Coronary artery disease Father     CABG  . Coronary artery disease Mother    History  Substance Use Topics  . Smoking status: Former Smoker    Quit date: 04/18/2011  . Smokeless tobacco: Not on file  . Alcohol Use: No   OB History    No data available     Review of Systems  Constitutional: Negative for fever, chills and fatigue.  HENT: Negative for trouble swallowing.   Eyes: Negative for visual disturbance.  Respiratory: Negative for shortness of breath.   Cardiovascular: Negative for chest pain and palpitations.  Gastrointestinal: Negative for nausea, vomiting, abdominal pain and diarrhea.  Genitourinary: Negative for dysuria and difficulty urinating.  Musculoskeletal: Negative for arthralgias and neck pain.  Skin: Negative for color change.  Neurological: Positive for seizures. Negative  for dizziness and weakness.  Psychiatric/Behavioral: Negative for dysphoric mood.      Allergies  Review of patient's allergies indicates no known allergies.  Home Medications   Prior to Admission medications   Medication Sig Start Date End Date Taking? Authorizing Provider  carvedilol (COREG) 6.25 MG tablet Take 1 tablet (6.25 mg total) by mouth 2 (two) times daily with a meal.  For your blood pressure and heart. 06/07/12 06/07/13  Babs Sciara, MD  furosemide (LASIX) 40 MG tablet Take 1 tablet (40 mg total) by mouth daily. For treatment of your heart. 08/13/11 08/12/12  Elliot Cousin, MD  insulin glargine (LANTUS) 100 UNIT/ML injection Inject 20 Units into the skin at bedtime. 06/04/11 06/03/12  Elliot Cousin, MD  insulin glargine (LANTUS) 100 UNIT/ML injection Inject 0.2 mLs (20 Units total) into the skin at bedtime. 06/15/12   Babs Sciara, MD  omeprazole (PRILOSEC) 20 MG capsule Take 20 mg by mouth daily.    Historical Provider, MD  potassium chloride (K-DUR) 10 MEQ tablet Take 1 tablet (10 mEq total) by mouth 2 (two) times daily. 08/13/11 08/12/12  Elliot Cousin, MD  promethazine (PHENERGAN) 12.5 MG tablet Take 1 tablet (12.5 mg total) by mouth every 6 (six) hours as needed for nausea. 08/13/11 08/20/11  Elliot Cousin, MD  spironolactone (ALDACTONE) 12.5 mg TABS Take 0.5 tablets (12.5 mg total) by mouth 2 (two) times daily. For your heart. 08/13/11 08/12/12  Elliot Cousin, MD   BP 179/113 mmHg  Pulse 120  Temp(Src) 98.8 F (37.1 C) (Oral)  Resp 16  SpO2 98%  LMP 09/03/2014 Physical Exam  Constitutional: She is oriented to person, place, and time. She appears well-developed and well-nourished. No distress.  HENT:  Head: Normocephalic and atraumatic.  Eyes: Conjunctivae and EOM are normal.  Neck: Normal range of motion.  Cardiovascular: Regular rhythm.  Exam reveals no gallop and no friction rub.   No murmur heard. tachycardic  Pulmonary/Chest: Effort normal and breath sounds normal. She has no wheezes. She has no rales. She exhibits no tenderness.  Abdominal: Soft. She exhibits no distension. There is no tenderness. There is no rebound.  Musculoskeletal: Normal range of motion.  Neurological: She is alert and oriented to person, place, and time. Coordination normal.  Speech is goal-oriented. Moves limbs without ataxia.   Skin: Skin is warm and dry.  Psychiatric: She  has a normal mood and affect. Her behavior is normal.  Nursing note and vitals reviewed.   ED Course  Procedures (including critical care time)  CRITICAL CARE Performed by: Emilia Beck   Total critical care time: 1 hour  Critical care time was exclusive of separately billable procedures and treating other patients.  Critical care was necessary to treat or prevent imminent or life-threatening deterioration.  Critical care was time spent personally by me on the following activities: development of treatment plan with patient and/or surrogate as well as nursing, discussions with consultants, evaluation of patient's response to treatment, examination of patient, obtaining history from patient or surrogate, ordering and performing treatments and interventions, ordering and review of laboratory studies, ordering and review of radiographic studies, pulse oximetry and re-evaluation of patient's condition.   Labs Review Labs Reviewed  COMPREHENSIVE METABOLIC PANEL - Abnormal; Notable for the following:    Sodium 128 (*)    Potassium 2.7 (*)    Chloride 91 (*)    CO2 18 (*)    Glucose, Bld 994 (*)    BUN 23 (*)    Creatinine,  Ser 2.26 (*)    Calcium 7.8 (*)    Total Protein 5.9 (*)    Albumin 2.3 (*)    GFR calc non Af Amer 25 (*)    GFR calc Af Amer 29 (*)    Anion gap 19 (*)    All other components within normal limits  CBG MONITORING, ED - Abnormal; Notable for the following:    Glucose-Capillary >600 (*)    All other components within normal limits  I-STAT CG4 LACTIC ACID, ED - Abnormal; Notable for the following:    Lactic Acid, Venous 4.48 (*)    All other components within normal limits  I-STAT VENOUS BLOOD GAS, ED - Abnormal; Notable for the following:    pH, Ven 7.376 (*)    pCO2, Ven 39.3 (*)    pO2, Ven 64.0 (*)    All other components within normal limits  CBC  BLOOD GAS, VENOUS    Imaging Review Ct Head Wo Contrast  09/11/2014   CLINICAL DATA:   Hyperglycemia.  Seizures today.  EXAM: CT HEAD WITHOUT CONTRAST  TECHNIQUE: Contiguous axial images were obtained from the base of the skull through the vertex without intravenous contrast.  COMPARISON:  None.  FINDINGS: There is no intracranial hemorrhage, mass or evidence of acute infarction. There is mild generalized atrophy. No focal brain abnormality is evident. There is no extra-axial fluid collection. Bones appear unremarkable.  IMPRESSION: Mild generalized atrophy.  No acute findings.   Electronically Signed   By: Ellery Plunk M.D.   On: 09/11/2014 00:24      EKG Interpretation   Date/Time:  Sunday September 10 2014 22:17:03 EDT Ventricular Rate:  116 PR Interval:  155 QRS Duration: 80 QT Interval:  330 QTC Calculation: 458 R Axis:   -5 Text Interpretation:  Sinus tachycardia Biatrial enlargement Probable  anteroseptal infarct, old No significant change since last tracing  Confirmed by Gwendolyn Grant  MD, BLAIR [phone]) on 09/10/2014 10:34:12 PM      MDM   Final diagnoses:  New onset seizure  Diabetic ketoacidosis without coma associated with type 2 diabetes mellitus  AKI (acute kidney injury)    10:19 PM Labs pending. Patient tachycardic at 120 with remaining vitals stable. Patient will have fluids.   10:34 PM Patient had a witnessed seizure that lasted about 2 minutes while speaking with Dr. Gwendolyn Grant. Patient given 2mg  ativan. Patient currently post ictal.   1:02 AM Patient required additional 4mg  ativan and 10mg  haldol due to combative behavior and confusion following the seizure. Patient's head CT shows no acute changes. Patient is in DKA with an anion gap of 19. Patient started on insulin drip and will be admitted. Neurology paged but no response.   [address] Asher, PA-C 09/11/14 0455  Elwin Mocha, MD 09/12/14 [phone]

## 2014-09-11 ENCOUNTER — Inpatient Hospital Stay (HOSPITAL_COMMUNITY): Payer: BLUE CROSS/BLUE SHIELD

## 2014-09-11 DIAGNOSIS — N179 Acute kidney failure, unspecified: Secondary | ICD-10-CM | POA: Diagnosis not present

## 2014-09-11 DIAGNOSIS — E870 Hyperosmolality and hypernatremia: Secondary | ICD-10-CM | POA: Diagnosis present

## 2014-09-11 DIAGNOSIS — E1310 Other specified diabetes mellitus with ketoacidosis without coma: Secondary | ICD-10-CM | POA: Diagnosis not present

## 2014-09-11 DIAGNOSIS — G9340 Encephalopathy, unspecified: Secondary | ICD-10-CM

## 2014-09-11 DIAGNOSIS — Z794 Long term (current) use of insulin: Secondary | ICD-10-CM | POA: Diagnosis not present

## 2014-09-11 DIAGNOSIS — I4729 Other ventricular tachycardia: Secondary | ICD-10-CM | POA: Insufficient documentation

## 2014-09-11 DIAGNOSIS — R4182 Altered mental status, unspecified: Secondary | ICD-10-CM | POA: Diagnosis present

## 2014-09-11 DIAGNOSIS — Z823 Family history of stroke: Secondary | ICD-10-CM | POA: Diagnosis not present

## 2014-09-11 DIAGNOSIS — Z9114 Patient's other noncompliance with medication regimen: Secondary | ICD-10-CM | POA: Diagnosis present

## 2014-09-11 DIAGNOSIS — Z87891 Personal history of nicotine dependence: Secondary | ICD-10-CM | POA: Diagnosis not present

## 2014-09-11 DIAGNOSIS — E1100 Type 2 diabetes mellitus with hyperosmolarity without nonketotic hyperglycemic-hyperosmolar coma (NKHHC): Secondary | ICD-10-CM | POA: Diagnosis present

## 2014-09-11 DIAGNOSIS — I429 Cardiomyopathy, unspecified: Secondary | ICD-10-CM

## 2014-09-11 DIAGNOSIS — R451 Restlessness and agitation: Secondary | ICD-10-CM | POA: Diagnosis present

## 2014-09-11 DIAGNOSIS — E1101 Type 2 diabetes mellitus with hyperosmolarity with coma: Secondary | ICD-10-CM | POA: Diagnosis not present

## 2014-09-11 DIAGNOSIS — Z833 Family history of diabetes mellitus: Secondary | ICD-10-CM | POA: Diagnosis not present

## 2014-09-11 DIAGNOSIS — I472 Ventricular tachycardia: Secondary | ICD-10-CM | POA: Diagnosis not present

## 2014-09-11 DIAGNOSIS — E118 Type 2 diabetes mellitus with unspecified complications: Secondary | ICD-10-CM

## 2014-09-11 DIAGNOSIS — Z8249 Family history of ischemic heart disease and other diseases of the circulatory system: Secondary | ICD-10-CM | POA: Diagnosis not present

## 2014-09-11 DIAGNOSIS — I10 Essential (primary) hypertension: Secondary | ICD-10-CM | POA: Diagnosis present

## 2014-09-11 DIAGNOSIS — E876 Hypokalemia: Secondary | ICD-10-CM | POA: Diagnosis present

## 2014-09-11 DIAGNOSIS — E1110 Type 2 diabetes mellitus with ketoacidosis without coma: Secondary | ICD-10-CM | POA: Diagnosis present

## 2014-09-11 DIAGNOSIS — R569 Unspecified convulsions: Secondary | ICD-10-CM | POA: Diagnosis not present

## 2014-09-11 DIAGNOSIS — I5042 Chronic combined systolic (congestive) and diastolic (congestive) heart failure: Secondary | ICD-10-CM | POA: Diagnosis present

## 2014-09-11 LAB — URINALYSIS W MICROSCOPIC (NOT AT ARMC)
Bilirubin Urine: NEGATIVE
Glucose, UA: >1000 mg/dL — AB
KETONES UR: 15 mg/dL — AB
Leukocytes, UA: NEGATIVE
Nitrite: NEGATIVE
PH: 5.5 (ref 5.0–8.0)
Protein, ur: 100 mg/dL — AB
SPECIFIC GRAVITY, URINE: 1.01 (ref 1.005–1.030)
Urobilinogen, UA: 0.2 mg/dL (ref 0.0–1.0)

## 2014-09-11 LAB — RAPID URINE DRUG SCREEN, HOSP PERFORMED
Amphetamines: NOT DETECTED
BARBITURATES: NOT DETECTED
Benzodiazepines: NOT DETECTED
Cocaine: NOT DETECTED
Opiates: NOT DETECTED
TETRAHYDROCANNABINOL: NOT DETECTED

## 2014-09-11 LAB — GLUCOSE, CAPILLARY
GLUCOSE-CAPILLARY: 193 mg/dL — AB (ref 65–99)
GLUCOSE-CAPILLARY: 97 mg/dL (ref 65–99)
GLUCOSE-CAPILLARY: 116 mg/dL — AB (ref 65–99)
Glucose-Capillary: 91 mg/dL (ref 65–99)
Glucose-Capillary: 92 mg/dL (ref 65–99)
Glucose-Capillary: 108 mg/dL — ABNORMAL HIGH (ref 65–99)
Glucose-Capillary: 137 mg/dL — ABNORMAL HIGH (ref 65–99)
Glucose-Capillary: 159 mg/dL — ABNORMAL HIGH (ref 65–99)
Glucose-Capillary: 170 mg/dL — ABNORMAL HIGH (ref 65–99)

## 2014-09-11 LAB — BASIC METABOLIC PANEL
ANION GAP: 10 (ref 5–15)
Anion gap: 12 (ref 5–15)
Anion gap: 13 (ref 5–15)
Anion gap: 8 (ref 5–15)
Anion gap: 10 (ref 5–15)
BUN: 19 mg/dL (ref 6–20)
BUN: 16 mg/dL (ref 6–20)
BUN: 16 mg/dL (ref 6–20)
BUN: 14 mg/dL (ref 6–20)
BUN: 13 mg/dL (ref 6–20)
CALCIUM: 8.2 mg/dL — AB (ref 8.9–10.3)
CALCIUM: 8.1 mg/dL — AB (ref 8.9–10.3)
CHLORIDE: 101 mmol/L (ref 101–111)
CHLORIDE: 111 mmol/L (ref 101–111)
CHLORIDE: 112 mmol/L — AB (ref 101–111)
CO2: 21 mmol/L — AB (ref 22–32)
CO2: 20 mmol/L — ABNORMAL LOW (ref 22–32)
CO2: 24 mmol/L (ref 22–32)
CO2: 24 mmol/L (ref 22–32)
CO2: 23 mmol/L (ref 22–32)
Calcium: 8.3 mg/dL — ABNORMAL LOW (ref 8.9–10.3)
Calcium: 8.8 mg/dL — ABNORMAL LOW (ref 8.9–10.3)
Calcium: 8.5 mg/dL — ABNORMAL LOW (ref 8.9–10.3)
Chloride: 113 mmol/L — ABNORMAL HIGH (ref 101–111)
Chloride: 111 mmol/L (ref 101–111)
Creatinine, Ser: 2.07 mg/dL — ABNORMAL HIGH (ref 0.44–1.00)
Creatinine, Ser: 1.85 mg/dL — ABNORMAL HIGH (ref 0.44–1.00)
Creatinine, Ser: 1.6 mg/dL — ABNORMAL HIGH (ref 0.44–1.00)
Creatinine, Ser: 1.45 mg/dL — ABNORMAL HIGH (ref 0.44–1.00)
Creatinine, Ser: 1.46 mg/dL — ABNORMAL HIGH (ref 0.44–1.00)
GFR calc Af Amer: 32 mL/min — ABNORMAL LOW (ref >60)
GFR calc Af Amer: 50 mL/min — ABNORMAL LOW (ref >60)
GFR calc Af Amer: 49 mL/min — ABNORMAL LOW (ref >60)
GFR calc non Af Amer: 28 mL/min — ABNORMAL LOW (ref >60)
GFR calc non Af Amer: 32 mL/min — ABNORMAL LOW (ref >60)
GFR calc non Af Amer: 38 mL/min — ABNORMAL LOW (ref >60)
GFR calc non Af Amer: 43 mL/min — ABNORMAL LOW (ref >60)
GFR, EST AFRICAN AMERICAN: 37 mL/min — AB (ref >60)
GFR, EST AFRICAN AMERICAN: 44 mL/min — AB (ref >60)
GFR, EST NON AFRICAN AMERICAN: 42 mL/min — AB (ref >60)
GLUCOSE: 835 mg/dL — AB (ref 65–99)
GLUCOSE: 312 mg/dL — AB (ref 65–99)
GLUCOSE: 102 mg/dL — AB (ref 65–99)
GLUCOSE: 113 mg/dL — AB (ref 65–99)
Glucose, Bld: 128 mg/dL — ABNORMAL HIGH (ref 65–99)
POTASSIUM: 3 mmol/L — AB (ref 3.5–5.1)
POTASSIUM: 2.9 mmol/L — AB (ref 3.5–5.1)
POTASSIUM: 2.9 mmol/L — AB (ref 3.5–5.1)
POTASSIUM: 2.8 mmol/L — AB (ref 3.5–5.1)
Potassium: 2.8 mmol/L — ABNORMAL LOW (ref 3.5–5.1)
SODIUM: 134 mmol/L — AB (ref 135–145)
SODIUM: 145 mmol/L (ref 135–145)
SODIUM: 144 mmol/L (ref 135–145)
Sodium: 144 mmol/L (ref 135–145)
Sodium: 146 mmol/L — ABNORMAL HIGH (ref 135–145)

## 2014-09-11 LAB — CBG MONITORING, ED
GLUCOSE-CAPILLARY: 525 mg/dL — AB (ref 65–99)
GLUCOSE-CAPILLARY: 418 mg/dL — AB (ref 65–99)
Glucose-Capillary: 595 mg/dL (ref 65–99)
Glucose-Capillary: >600 mg/dL (ref 65–99)
Glucose-Capillary: 561 mg/dL (ref 65–99)
Glucose-Capillary: >600 mg/dL (ref 65–99)
Glucose-Capillary: 286 mg/dL — ABNORMAL HIGH (ref 65–99)
Glucose-Capillary: 236 mg/dL — ABNORMAL HIGH (ref 65–99)

## 2014-09-11 LAB — ETHANOL

## 2014-09-11 LAB — OSMOLALITY, URINE: OSMOLALITY UR: 422 mosm/kg (ref 390–1090)

## 2014-09-11 LAB — I-STAT ARTERIAL BLOOD GAS, ED
Acid-base deficit: 1 mmol/L (ref 0.0–2.0)
Bicarbonate: 24.5 mEq/L — ABNORMAL HIGH (ref 20.0–24.0)
O2 Saturation: 96 %
Patient temperature: 98.7
TCO2: 26 mmol/L (ref 0–100)
pCO2 arterial: 43.9 mmHg (ref 35.0–45.0)
pH, Arterial: 7.355 (ref 7.350–7.450)
pO2, Arterial: 86 mmHg (ref 80.0–100.0)

## 2014-09-11 LAB — COMPREHENSIVE METABOLIC PANEL
ALT: 16 U/L (ref 14–54)
AST: 20 U/L (ref 15–41)
Albumin: 2.3 g/dL — ABNORMAL LOW (ref 3.5–5.0)
Alkaline Phosphatase: 103 U/L (ref 38–126)
Anion gap: 19 — ABNORMAL HIGH (ref 5–15)
BUN: 23 mg/dL — AB (ref 6–20)
CALCIUM: 7.8 mg/dL — AB (ref 8.9–10.3)
CHLORIDE: 91 mmol/L — AB (ref 101–111)
CO2: 18 mmol/L — ABNORMAL LOW (ref 22–32)
CREATININE: 2.26 mg/dL — AB (ref 0.44–1.00)
GFR calc non Af Amer: 25 mL/min — ABNORMAL LOW (ref >60)
GFR, EST AFRICAN AMERICAN: 29 mL/min — AB (ref >60)
Glucose, Bld: 994 mg/dL (ref 65–99)
POTASSIUM: 2.7 mmol/L — AB (ref 3.5–5.1)
Sodium: 128 mmol/L — ABNORMAL LOW (ref 135–145)
Total Bilirubin: 0.8 mg/dL (ref 0.3–1.2)
Total Protein: 5.9 g/dL — ABNORMAL LOW (ref 6.5–8.1)

## 2014-09-11 LAB — MRSA PCR SCREENING: MRSA by PCR: NEGATIVE

## 2014-09-11 LAB — ACETAMINOPHEN LEVEL

## 2014-09-11 LAB — LACTIC ACID, PLASMA: Lactic Acid, Venous: 1.4 mmol/L (ref 0.5–2.0)

## 2014-09-11 LAB — POC URINE PREG, ED: PREG TEST UR: NEGATIVE

## 2014-09-11 LAB — BETA-HYDROXYBUTYRIC ACID: Beta-Hydroxybutyric Acid: 2.34 mmol/L — ABNORMAL HIGH (ref 0.05–0.27)

## 2014-09-11 MED ORDER — DIPHENHYDRAMINE HCL 50 MG/ML IJ SOLN: 12.5000 mg | Freq: Four times a day (QID) | INTRAMUSCULAR | Status: DC | PRN

## 2014-09-11 MED ORDER — DEXTROSE-NACL 5-0.45 % IV SOLN: INTRAVENOUS | Status: DC

## 2014-09-11 MED ORDER — SODIUM CHLORIDE 0.9 % IV SOLN: INTRAVENOUS | Status: DC

## 2014-09-11 MED ORDER — POTASSIUM CHLORIDE 10 MEQ/100ML IV SOLN
10.0000 meq | INTRAVENOUS | Status: AC
  Administered 2014-09-11 – 2014-09-12 (×4): 10 meq via INTRAVENOUS
  Filled 2014-09-11 (×4): qty 100

## 2014-09-11 MED ORDER — SODIUM CHLORIDE 0.9 % IV BOLUS (SEPSIS)
1000.0000 mL | INTRAVENOUS | Status: AC
  Administered 2014-09-11 (×2): 1000 mL via INTRAVENOUS

## 2014-09-11 MED ORDER — POTASSIUM CHLORIDE 10 MEQ/100ML IV SOLN: 10.0000 meq | INTRAVENOUS | Status: AC

## 2014-09-11 MED ORDER — DEXTROSE-NACL 5-0.45 % IV SOLN
INTRAVENOUS | Status: DC
  Administered 2014-09-11 – 2014-09-12 (×2): via INTRAVENOUS

## 2014-09-11 MED ORDER — LORAZEPAM 2 MG/ML IJ SOLN
0.5000 mg | INTRAMUSCULAR | Status: DC | PRN
  Administered 2014-09-11: 0.5 mg via INTRAVENOUS
  Filled 2014-09-11: qty 1

## 2014-09-11 MED ORDER — NYSTATIN 100000 UNIT/ML MT SUSP
5.0000 mL | Freq: Four times a day (QID) | OROMUCOSAL | Status: DC
  Administered 2014-09-11 – 2014-09-13 (×7): 500000 [IU] via ORAL
  Filled 2014-09-11 (×9): qty 5

## 2014-09-11 MED ORDER — INSULIN REGULAR HUMAN 100 UNIT/ML IJ SOLN
INTRAMUSCULAR | Status: DC
  Filled 2014-09-11: qty 2.5

## 2014-09-11 MED ORDER — INSULIN ASPART 100 UNIT/ML ~~LOC~~ SOLN
0.0000 [IU] | SUBCUTANEOUS | Status: DC
  Administered 2014-09-11: 2 [IU] via SUBCUTANEOUS
  Administered 2014-09-12: 3 [IU] via SUBCUTANEOUS
  Administered 2014-09-12: 2 [IU] via SUBCUTANEOUS
  Administered 2014-09-12: 1 [IU] via SUBCUTANEOUS
  Administered 2014-09-12 (×2): 2 [IU] via SUBCUTANEOUS
  Administered 2014-09-12 – 2014-09-13 (×2): 5 [IU] via SUBCUTANEOUS
  Administered 2014-09-13 (×2): 2 [IU] via SUBCUTANEOUS

## 2014-09-11 MED ORDER — HEPARIN SODIUM (PORCINE) 5000 UNIT/ML IJ SOLN
5000.0000 [IU] | Freq: Three times a day (TID) | INTRAMUSCULAR | Status: DC
  Administered 2014-09-11 – 2014-09-13 (×6): 5000 [IU] via SUBCUTANEOUS
  Filled 2014-09-11 (×8): qty 1

## 2014-09-11 MED ORDER — INSULIN ASPART 100 UNIT/ML ~~LOC~~ SOLN
10.0000 [IU] | Freq: Once | SUBCUTANEOUS | Status: AC
  Administered 2014-09-11: 10 [IU] via INTRAVENOUS
  Filled 2014-09-11: qty 1

## 2014-09-11 MED ORDER — POTASSIUM CHLORIDE 10 MEQ/100ML IV SOLN
10.0000 meq | INTRAVENOUS | Status: AC
  Administered 2014-09-11: 10 meq via INTRAVENOUS
  Filled 2014-09-11: qty 100

## 2014-09-11 MED ORDER — SODIUM CHLORIDE 0.9 % IV SOLN
INTRAVENOUS | Status: DC
  Administered 2014-09-11: 4.7 [IU]/h via INTRAVENOUS
  Filled 2014-09-11: qty 2.5

## 2014-09-11 MED ORDER — POTASSIUM CHLORIDE 10 MEQ/100ML IV SOLN
10.0000 meq | INTRAVENOUS | Status: AC
  Administered 2014-09-11 (×4): 10 meq via INTRAVENOUS
  Filled 2014-09-11 (×4): qty 100

## 2014-09-11 MED ORDER — LORAZEPAM 2 MG/ML IJ SOLN: 0.5000 mg | INTRAMUSCULAR | Status: DC

## 2014-09-11 MED ORDER — HALOPERIDOL LACTATE 5 MG/ML IJ SOLN
1.0000 mg | Freq: Four times a day (QID) | INTRAMUSCULAR | Status: DC | PRN
  Administered 2014-09-11: 1 mg via INTRAVENOUS
  Filled 2014-09-11: qty 1

## 2014-09-11 MED ORDER — MAGNESIUM SULFATE 2 GM/50ML IV SOLN
2.0000 g | Freq: Once | INTRAVENOUS | Status: AC
  Administered 2014-09-11: 2 g via INTRAVENOUS
  Filled 2014-09-11: qty 50

## 2014-09-11 MED ORDER — CARVEDILOL 6.25 MG PO TABS
6.2500 mg | ORAL_TABLET | Freq: Two times a day (BID) | ORAL | Status: DC
Start: 2014-09-11 — End: 2014-09-13
  Administered 2014-09-11 – 2014-09-13 (×4): 6.25 mg via ORAL
  Filled 2014-09-11 (×6): qty 1

## 2014-09-11 NOTE — Progress Notes (Addendum)
Patient seen and examined, patient very agitated and confused, not following commend, not open eyes to voice, maintaining airway, snoring at times. Patient not cooperative, trying to pulling on IV, she won't allow to place another IV, won't allow for blood draw. There is 13 beats run vtach on monitor, low k, will replace k, mag by iv, ICU consulted for possible transfer to ICU.  Admitting physician talked to neurology for possible seizure activity, neurology advised to consult if recurrent, otherwise no need of neuro consult. Will continue to observe.  Time spent: 7:45am to 8:35am

## 2014-09-11 NOTE — Consult Note (Addendum)
PULMONARY / CRITICAL CARE MEDICINE   Name: Lauren Steele MRN: [id number] DOB: 1971-02-16    ADMISSION DATE:  09/10/2014 CONSULTATION DATE:  6/26  REFERRING MD :  Roda Shutters  CHIEF COMPLAINT:  Agitated delirium   INITIAL PRESENTATION:  44 year old female w/ h/o DM, HTN and medical non-compliance. Admitted on 6/27 after witnessed seizure-like event. Dx eval in ER c/w DKA. Was to be admitted w/ working dx of DKA and r/o seizure. Very agitated and combative at times so felt to require ICU level care. PCCM asked to admit  STUDIES:  EEG 6/27>>>   SIGNIFICANT EVENTS:    HISTORY OF PRESENT ILLNESS:   44 year old female w/ history of insulin dependent diabetes and medical non-compliance admitted on 6/27 after neighbor witnessed seizure-like episode. On arrival to the ED she was agitated and confused. On arrival blood glucose measured as "high", lactic acid was 4.48, anion gap was 19, beta-hydroxybuyric acid was +.  She was to be admitted w/ working dx of DKA and possible seizure. Had another witnessed seizure by EDP lasting 30 seconds. She was given IV ativan for this. Was combative and confused after. Received several doses of ativan and haldol due to agitation and combativeness. Lactic acid cleared to 1.4. PCCM was asked to assume care as it was felt that based on the pt's level of agitation she would need ICU level monitoring. Just prior to PCCM arrival had several beat run of VT. On arrival she was slow to arouse. Once awake was not cooperative, and then would doze back to sleep, glucose down to 236, AG down to 13. PCCM asked to admit   PAST MEDICAL HISTORY :   has a past medical history of Hypertension; Diabetes mellitus; Tachycardia (06/02/2011); Noncompliance; MRSA infection (06/02/2011); Cardiomyopathy (08/12/2011); Acute systolic CHF (congestive heart failure) (08/08/2011); Abnormal thyroid function test (08/13/2011); Diastolic dysfunction (08/12/2011.); and Biliary dyskinesia (03/2010.).  has no  past surgical history on file. Prior to Admission medications   Medication Sig Start Date End Date Taking? Authorizing Provider  carvedilol (COREG) 6.25 MG tablet Take 1 tablet (6.25 mg total) by mouth 2 (two) times daily with a meal. For your blood pressure and heart. 06/07/12 09/11/15 Yes Babs Sciara, MD  furosemide (LASIX) 40 MG tablet Take 1 tablet (40 mg total) by mouth daily. For treatment of your heart. 08/13/11 09/11/15 Yes Elliot Cousin, MD  insulin glargine (LANTUS) 100 UNIT/ML injection Inject 20 Units into the skin at bedtime. 06/04/11 09/11/15 Yes Elliot Cousin, MD  insulin glargine (LANTUS) 100 UNIT/ML injection Inject 0.2 mLs (20 Units total) into the skin at bedtime. 06/15/12  Yes Babs Sciara, MD  omeprazole (PRILOSEC) 20 MG capsule Take 20 mg by mouth daily.   Yes Historical Provider, MD  potassium chloride (K-DUR) 10 MEQ tablet Take 1 tablet (10 mEq total) by mouth 2 (two) times daily. 08/13/11 09/11/15 Yes Elliot Cousin, MD  promethazine (PHENERGAN) 12.5 MG tablet Take 1 tablet (12.5 mg total) by mouth every 6 (six) hours as needed for nausea. 08/13/11 09/11/15 Yes Elliot Cousin, MD  spironolactone (ALDACTONE) 12.5 mg TABS Take 0.5 tablets (12.5 mg total) by mouth 2 (two) times daily. For your heart. 08/13/11 09/11/15 Yes Elliot Cousin, MD   No Known Allergies  FAMILY HISTORY:  has no family status information on file.  SOCIAL HISTORY:  reports that she quit smoking about 3 years ago. She does not have any smokeless tobacco history on file. She reports that she does not drink  alcohol or use illicit drugs.  REVIEW OF SYSTEMS:  Unable   VITAL SIGNS: Temp:  [98.8 F (37.1 C)] 98.8 F (37.1 C) (06/26 2202) Pulse Rate:  [94-120] 109 (06/27 0947) Resp:  [16-26] 20 (06/27 0947) BP: (144-179)/(91-113) 166/98 mmHg (06/27 0947) SpO2:  [94 %-100 %] 100 % (06/27 0947) HEMODYNAMICS:   VENTILATOR SETTINGS:   INTAKE / OUTPUT:  Intake/Output Summary (Last 24 hours) at 09/11/14  1005 Last data filed at 09/11/14 [id number]  Gross per 24 hour  Intake      0 ml  Output   2450 ml  Net  -2450 ml    PHYSICAL EXAMINATION: General:  44 year old female, resting quietly now in stretcher  Neuro:  Opens eyes to gently physical stimuli, moves all ext, currently not verbal, will not f/c HEENT:  Mucous membranes are dry, tongue is dry and crusted. Neck veins are flat  Cardiovascular:  rrr Lungs:  Clear  Abdomen:  Soft, + bowel sounds no OM  Musculoskeletal:  Intact  Skin:  Dry, warm and intact   LABS:  CBC  Recent Labs Lab 09/10/14 2325  WBC 7.7  HGB 12.6  HCT 38.3  PLT 393   Coag's No results for input(s): APTT, INR in the last 168 hours. BMET  Recent Labs Lab 09/10/14 2325 09/11/14 0248 09/11/14 0850  NA 128* 134* 144  K 2.7* 3.0* 2.9*  CL 91* 101 111  CO2 18* 21* 20*  BUN 23* 19 16  CREATININE 2.26* 2.07* 1.85*  GLUCOSE 994* 835* 312*   Electrolytes  Recent Labs Lab 09/10/14 2325 09/11/14 0248 09/11/14 0850  CALCIUM 7.8* 8.3* 8.8*   Sepsis Markers  Recent Labs Lab 09/10/14 2344 09/11/14 0248  LATICACIDVEN 4.48* 1.4   ABG  Recent Labs Lab 09/11/14 0219  PHART 7.355  PCO2ART 43.9  PO2ART 86.0   Liver Enzymes  Recent Labs Lab 09/10/14 2325  AST 20  ALT 16  ALKPHOS 103  BILITOT 0.8  ALBUMIN 2.3*   Cardiac Enzymes No results for input(s): TROPONINI, PROBNP in the last 168 hours. Glucose  Recent Labs Lab 09/11/14 0428 09/11/14 0459 09/11/14 0502 09/11/14 0625 09/11/14 0749 09/11/14 0850  GLUCAP 595* >600* 561* 525* 418* 286*    Imaging Ct Head Wo Contrast  09/11/2014   CLINICAL DATA:  Hyperglycemia.  Seizures today.  EXAM: CT HEAD WITHOUT CONTRAST  TECHNIQUE: Contiguous axial images were obtained from the base of the skull through the vertex without intravenous contrast.  COMPARISON:  None.  FINDINGS: There is no intracranial hemorrhage, mass or evidence of acute infarction. There is mild generalized atrophy. No  focal brain abnormality is evident. There is no extra-axial fluid collection. Bones appear unremarkable.  IMPRESSION: Mild generalized atrophy.  No acute findings.   Electronically Signed   By: Ellery Plunkaniel R Mitchell M.D.   On: 09/11/2014 00:24     ASSESSMENT / PLAN:  PULMONARY OETT A: Aspiration risk P:   NPO Pulse ox   CARDIOVASCULAR CVL A:  HTN ST VT  P:  Cont tele  Low dose labetolol for SBP > 170  Resume home meds when able to take POs   RENAL A:   +AGMA: lactic acid AND DKA: suspect lactic acid was d/t seizure type activity  AKI Severe hypokalemia  P:   IV hydration  Replace K aggressively  Strict I&O  GASTROINTESTINAL A:   No acute  P:   NPO for now   HEMATOLOGIC A:   No acute. Suspect however h&h  will drop w/ hydration  P:  Trend CBC Cont [place] heparin   INFECTIOUS A:  No acute  P:   BCx2 6/27>>> UC  6/27>>>   ENDOCRINE A:   DKA w/ medical non-compliance  P:   DKA protocol IV hydration  Close obs of K Ready to transition to D51/2 NS   NEUROLOGIC A:   Acute encephalopathy in setting of DKA and probably post-ictal state  Seizure activity: neuro called and advised to monitor given metabolic derangements  P:   RASS goal: 0 Seizure precautions EEG No more sedation  Supportive care    FAMILY  - Updates: pending   - Inter-disciplinary family meet or Palliative Care meeting due by:  7/2    TODAY'S SUMMARY: DKA + possible seizure w/ severe encephalopathy which is likely a mix of her DKA and post-ictal state. Will admit to ICU given level of delirium, cont DKA protocol and supportive care. Suspect as metabolic derangements improve so will her encephalopathy.    Simonne Martinet ACNP-BC [facility] Pulmonary/Critical Care Pager # [phone] OR # [phone] if no answer   Will admit to the ICU for observation overnight, r/o MI, treat for DKA and possible seizure.  Anticipate that the encephalopathy is post ictal but that was not witnessed.  DKA can  not be ignored as a possibility as well.  Patient is protecting her airway for now but given encephalopathy that will need to be monitored very closely.  The patient is critically ill with multiple organ systems failure and requires high complexity decision making for assessment and support, frequent evaluation and titration of therapies, application of advanced monitoring technologies and extensive interpretation of multiple databases.   Critical Care Time devoted to patient care services described in this note is  35  Minutes. This time reflects time of care of this signee Dr Koren Bound. This critical care time does not reflect procedure time, or teaching time or supervisory time of PA/NP/Med student/Med Resident etc but could involve care discussion time.  Alyson Reedy, M.D. [facility] Pulmonary/Critical Care Medicine. Pager: [phone]. After hours pager: [phone].  09/11/2014, 10:05 AM

## 2014-09-11 NOTE — Progress Notes (Signed)
Pt arrived from ED with mittens on only, no restraints applied at this time.  Lauren Steele

## 2014-09-11 NOTE — ED Notes (Signed)
Admitting MD at bedside to evaluate patient

## 2014-09-11 NOTE — Progress Notes (Signed)
Called Elink MD, insulin drip has been off since 1442 per glucose stabilizer instructions to set rate at 0.00. Last 3 BG readings have been 91, 92, 108. Pt is receiving IV fluids per DKA protocol.  Dr. Arsenio LoaderSommer instructed me to turn insulin drip back on at 0.1 units/hr and to call him back with the next BG reading to get updated plan of care.   Lauren Steele

## 2014-09-11 NOTE — ED Notes (Addendum)
Patient was resting. This nurse placed BP cuff on patient. Patient began trying to remove all medical equipment. Patient attempted to bite mittens off. MD at bedside. Made aware. Gave Verbal order 0.5 ativan. Patient able to recognize family members. Patient alert & o to self, and place but not time.

## 2014-09-11 NOTE — ED Notes (Signed)
Patient woke up and states, " I wanted to get out of here" " I have to go the restroom." Patient advised she was placed on bedpan. Patient states" I want to walk" Patient advised I did not want her to fall. Patient started swinging her arms and scooting to the edge of the bed. Patient advised she could not get out of bed. Patient then began attempting to take out IV. Patient advised she has to keep IV in.

## 2014-09-11 NOTE — ED Notes (Addendum)
Patient was transported to CT with this nurse and EMT noah. Patient had to be placed on 2.5L of o2 while  Being scan due to her desat while laying flat.

## 2014-09-11 NOTE — ED Notes (Signed)
Patient resting.

## 2014-09-11 NOTE — ED Notes (Signed)
Patient refuse to get on BP cuff pulse ox and cardiac leads. This nurse redirected patient to leave items on. Patient advised, " I cant keep it on". Patient began to bite a medical equipment.

## 2014-09-11 NOTE — Procedures (Signed)
History: 44 yo F with seizures   Sedation: None  Technique: This is a 19 channel routine scalp EEG performed at the bedside with bipolar and monopolar montages arranged in accordance to the international 10/20 system of electrode placement. One channel was dedicated to EKG recording.    Background: The background consists of intermixed alpha and beta activities. There is a well defined posterior dominant rhythm of 9.5 Hz that attenuates with eye opening. Sleep is recorded with normal appearing structures.   Photic stimulation: Physiologic driving is not performed  EEG Abnormalities: None  Clinical Interpretation: This normal EEG is recorded in the waking and sleep state. There was no seizure or seizure predisposition recorded on this study.   Ritta SlotMcNeill Corona Popovich, MD [facility] [phone]  If 7pm- 7am, please page neurology on call as listed in AMION.

## 2014-09-11 NOTE — ED Notes (Signed)
Attempted to obtain BP Patient becomes combactive MD made aware unable to keep BP cuff. Patient montior HR and o2.

## 2014-09-11 NOTE — H&P (Signed)
[facility] History and Physical  Patient: Lauren Steele  MRN: [id number]  DOB: 09/12/1970  DOS: the patient was seen and examined on 09/11/2014 PCP: Lilyan Punt, MD  Referring physician: Dr. Gwendolyn Grant Chief Complaint: Seizure  HPI: Lauren Steele is a 44 y.o. female with Past medical history of diabetes mellitus, noncompliance with medication, hypertension, cardiomyopathy, morbid obesity, combined CHF. The patient is presenting with complaints of seizure-like episode. Patient's neighbor witnessed seizure-like event while the patient was outside of her house. She did not have any tongue bite did not have any loss of control of bowel or bladder. The patient was not oriented on arrival but later on was able to provide history. While in the ER she had another episode of seizure requiring 6 mg of Ativan. After that the patient was drowsy and was referred for admission. Patient has not been taking her medication since last one and a half month. Significant other denies any history of alcohol abuse or any drug abuse. There was no fall, trauma or injury reported.  The patient is coming from home.  At her baseline ambulates without any support And is independent for most of her ADL manages her medication on her own.  Review of Systems: as mentioned in the history of present illness.  A comprehensive review of the other systems is negative.  Past Medical History  Diagnosis Date  . Hypertension   . Diabetes mellitus   . 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 Noncompliance   . MRSA infection 06/02/2011  . Cardiomyopathy 08/12/2011    Ejection fraction 20-25%.  . Acute systolic CHF (congestive heart failure) 08/08/2011    New diagnosis.  . Abnormal thyroid function test 08/13/2011    Slightly low TSH, slightly low free T3, and slightly elevated free T4.  . 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    Grade 2  . Biliary dyskinesia 03/2010.    Gallbladder ejection fraction 22%.    History reviewed. No pertinent past surgical history. Social History:  reports that she quit smoking about 3 years ago. She does not have any smokeless tobacco history on file. She reports that she does not drink alcohol or use illicit drugs.  No Known Allergies  Family History  Problem Relation Age of Onset  . Diabetes Mother   . Diabetes Father   . Kidney disease Mother   . Kidney disease Father   . Stroke Father   . Coronary artery disease Father     CABG  . Coronary artery disease Mother     Prior to Admission medications   Medication Sig Start Date End Date Taking? Authorizing Provider  carvedilol (COREG) 6.25 MG tablet Take 1 tablet (6.25 mg total) by mouth 2 (two) times daily with a meal. For your blood pressure and heart. 06/07/12 09/11/15 Yes Babs Sciara, MD  furosemide (LASIX) 40 MG tablet Take 1 tablet (40 mg total) by mouth daily. For treatment of your heart. 08/13/11 09/11/15 Yes Elliot Cousin, MD  insulin glargine (LANTUS) 100 UNIT/ML injection Inject 20 Units into the skin at bedtime. 06/04/11 09/11/15 Yes Elliot Cousin, MD  insulin glargine (LANTUS) 100 UNIT/ML injection Inject 0.2 mLs (20 Units total) into the skin at bedtime. 06/15/12  Yes Babs Sciara, MD  omeprazole (PRILOSEC) 20 MG capsule Take 20 mg by mouth daily.   Yes Historical Provider, MD  potassium chloride (K-DUR) 10 MEQ tablet Take 1 tablet (10 mEq total) by mouth 2 (two) times daily. 08/13/11 09/11/15 Yes Elliot Cousin, MD  promethazine (PHENERGAN) 12.5 MG tablet Take 1 tablet (12.5 mg total) by mouth every 6 (six) hours as needed for nausea. 08/13/11 09/11/15 Yes Elliot Cousin, MD  spironolactone (ALDACTONE) 12.5 mg TABS Take 0.5 tablets (12.5 mg total) by mouth 2 (two) times daily. For your heart. 08/13/11 09/11/15 Yes Elliot Cousin, MD    Physical Exam: Filed Vitals:   09/11/14 0400 09/11/14 0436 09/11/14 0440 09/11/14 0510  BP:      Pulse: 119 96 95 94  Temp:      TempSrc:      Resp:      SpO2: 96%  96% 95% 97%    General: Intermittent drowsiness with episodes of alertness with Oriented to Time, Place and Person. Appear in mild distress Significant agitation in between Eyes: PERRL ENT: Oral Mucosa clear moist. Neck: no JVD Cardiovascular: S1 and S2 Present, no Murmur, Peripheral Pulses Present Respiratory: Bilateral Air entry equal and Decreased,  Clear to Auscultation, no Crackles, no wheezes Abdomen: Bowel Sound present, Soft and non tender Skin: no Rash Extremities: no Pedal edema,  Neurologic: Grossly no focal neuro deficit on limited examination.  Labs on Admission:  CBC:  Recent Labs Lab 09/10/14 2325  WBC 7.7  HGB 12.6  HCT 38.3  MCV 87.2  PLT 393    CMP     Component Value Date/Time   NA 134* 09/11/2014 0248   K 3.0* 09/11/2014 0248   CL 101 09/11/2014 0248   CO2 21* 09/11/2014 0248   GLUCOSE 835* 09/11/2014 0248   BUN 19 09/11/2014 0248   CREATININE 2.07* 09/11/2014 0248   CALCIUM 8.3* 09/11/2014 0248   PROT 5.9* 09/10/2014 2325   ALBUMIN 2.3* 09/10/2014 2325   AST 20 09/10/2014 2325   ALT 16 09/10/2014 2325   ALKPHOS 103 09/10/2014 2325   BILITOT 0.8 09/10/2014 2325   GFRNONAA 28* 09/11/2014 0248   GFRAA 32* 09/11/2014 0248    No results for input(s): LIPASE, AMYLASE in the last 168 hours.  No results for input(s): CKTOTAL, CKMB, CKMBINDEX, TROPONINI in the last 168 hours. BNP (last 3 results) No results for input(s): BNP in the last 8760 hours.  ProBNP (last 3 results) No results for input(s): PROBNP in the last 8760 hours.   Radiological Exams on Admission: Ct Head Wo Contrast  09/11/2014   CLINICAL DATA:  Hyperglycemia.  Seizures today.  EXAM: CT HEAD WITHOUT CONTRAST  TECHNIQUE: Contiguous axial images were obtained from the base of the skull through the vertex without intravenous contrast.  COMPARISON:  None.  FINDINGS: There is no intracranial hemorrhage, mass or evidence of acute infarction. There is mild generalized atrophy. No  focal brain abnormality is evident. There is no extra-axial fluid collection. Bones appear unremarkable.  IMPRESSION: Mild generalized atrophy.  No acute findings.   Electronically Signed   By: Ellery Plunk M.D.   On: 09/11/2014 00:24   EKG: Independently reviewed. normal sinus rhythm, nonspecific ST and T waves changes, QT 450.  Assessment/Plan Principal Problem:   DKA (diabetic ketoacidoses) Active Problems:   Hypokalemia   Type II diabetes mellitus with complication, uncontrolled   HTN (hypertension)   Cardiomyopathy   Seizure   AKI (acute kidney injury)   Agitation   Hyperosmolar non-ketotic state in patient with type 2 diabetes mellitus   1. DKA (diabetic ketoacidoses)  Versus hyperosmolar nonketotic state in patient with type 2 diabetes mellitus  The patient is presenting with seizure-like episode. CT of the head is unremarkable. No drug abuse or alcohol  abuse. She is found to have significant hyperglycemia, with anion gap with ketones in urine with increased beta hydroxybutyrate. She also has acute kidney injury as well. That she is likely appearing to have DKA with hyperosmolar state. She has received aggressive IV hydration. Her potassium is also being replaced. She is on insulin infusion with glucose stabilizer. She remains nothing by mouth at present. Most likely reason for DKA is noncompliance with medications.  2. Agitation. Etiology unclear most likely secondary to seizure as well as Ativan and delirium with post ictal state.  EEG in morning. When necessary Haldol, QTC is not prolonged. When necessary Ativan  3. Cardiomyopathy.  the patient has chronic combined systolic and diastolic CHF. Currently she requires aggressive hydration secondary to her hyperglycemia and DKA. Monitor volume status. In and out daily weight.  4. Seizure episode. The patient is presenting with recurrent seizure-like episode. After that episode the patient is oriented and alert  to all her as per family. She had similar episode here even after here she was alert and oriented. She does not have any tongue bite or incontinence of bowel or bladder. Unclear if this is seizure or not. EEG in the morning rest of the workup underway. She is a prophylaxis.  Advance goals of care discussion: Full code   DVT Prophylaxis: subcutaneous Heparin Nutrition: npo  Family Communication: family was present at bedside, opportunity was given to ask question and all questions were answered satisfactorily at the time of interview. Disposition: Admitted as inpatient, step-down unit.  Author: Lynden Oxford, MD [facility] Pager: [phone] 09/11/2014  If 7PM-7AM, please contact night-coverage [email or web address] Password [email or web address]

## 2014-09-11 NOTE — ED Notes (Signed)
Pt had 13 beat run v-tach on monitor. Dr. Mosetta Putt called and made aware, reports critical care is coming to see patient. Also made aware potassium orders have timed out and have discontinued in system. Labs obtained.

## 2014-09-11 NOTE — ED Notes (Signed)
cbg 595 Before insulin given.

## 2014-09-11 NOTE — ED Notes (Signed)
Patient continues to be combative intermittently. Patient continues to try and get out bed, family at bedside with patient.

## 2014-09-11 NOTE — ED Notes (Signed)
MD ONI notified of critical labs.

## 2014-09-11 NOTE — ED Notes (Addendum)
AC down to evaluate patient for appropriateness of ICU placement. Spoke with Dr. Mosetta Putt reports to place foley catheter will order high dose of meds for sedation. Pt is combative, trying to get out of bed. Saying she was to pee, placed on bedpan multiple times. Plan to place foley catheter, use mittens. Rapid response plan to come to bedside to evaluate patient.

## 2014-09-11 NOTE — ED Notes (Addendum)
Critical Care PA, Pete to bedside. Pt will go to ICU. Pt resting, has intermittent spurts of combativeness, pulling at lines but has mittens in place. Pt mouth noted to be dry, respirations smooth and unlabored.Pt will not tolerate BP cuff.

## 2014-09-11 NOTE — ED Notes (Signed)
Patient removed IV in Left hand. This nurse place 20 g in Left forearm.

## 2014-09-11 NOTE — ED Notes (Signed)
Mittens placed on patient. Patient attempted to remove IV. Patient contuines to say she has to use the restroom. Patient remained she is not able to get out of bed due to fall risk. Patient placed on Bedpan and fell asleep.

## 2014-09-11 NOTE — ED Notes (Signed)
Patient woke up stating" I have to use the bathroom". EMT and RN Leeroy Bock  and Cassie Assisted patient on bedpan. Patient began to get combative. MD Allena Katz made aware. Stated to place soft restraints mittens   " mittens  placed on patient. MD Allena Katz stated he wanted patient to received two more litter of NS then 10 units of Novlog. Then Potassium

## 2014-09-11 NOTE — Progress Notes (Signed)
EEG Completed; Results Pending  

## 2014-09-11 NOTE — ED Notes (Signed)
MD Allena KatzPatel at bedside. MD Allena KatzPatel given update. Patient recent CBG. Patient mental status. Patient able to move extremities. Insulin currently running. Advised patient is still combative intermittently

## 2014-09-11 NOTE — ED Notes (Signed)
Spoke with MD Allena Katz states start insulin drip after 1st run of potassium is complete.

## 2014-09-11 NOTE — ED Notes (Signed)
Mittens removed  °

## 2014-09-11 NOTE — ED Notes (Signed)
Patient's husband took belongings home. This nurse at bedside with patient.

## 2014-09-11 NOTE — ED Notes (Signed)
MD Allena Katz notified patient still combative intermittently.

## 2014-09-11 NOTE — Progress Notes (Signed)
eLink Physician-Brief Progress Note Patient Name: Lauren DoughtyStephanie A Ekstrom DOB: Oct 31, 1970 MRN: [id number]   Date of Service  09/11/2014  HPI/Events of Note  Patient is on an Insulin IV infusion for DKA. Last blood glucose = 159. HCO3- = 23 and Anion Gap = 10. K+ = 2.8. Creatinine = 1.48  eICU Interventions  Will order: 1. Diabetic Clear liquid diet. 2. D/C Insulin IV infusion.  3. Start sensitive Novolog SSI Q 4 hours. (Not sure she will eat as she is lethargic.) 4. Replace K+      Intervention Category Intermediate Interventions: Hyperglycemia - evaluation and treatment  Sommer,Steven Eugene 09/11/2014, 8:01 PM

## 2014-09-12 DIAGNOSIS — E876 Hypokalemia: Secondary | ICD-10-CM

## 2014-09-12 DIAGNOSIS — I10 Essential (primary) hypertension: Secondary | ICD-10-CM

## 2014-09-12 DIAGNOSIS — R451 Restlessness and agitation: Secondary | ICD-10-CM

## 2014-09-12 LAB — GLUCOSE, CAPILLARY
GLUCOSE-CAPILLARY: 157 mg/dL — AB (ref 65–99)
GLUCOSE-CAPILLARY: 152 mg/dL — AB (ref 65–99)
GLUCOSE-CAPILLARY: 251 mg/dL — AB (ref 65–99)
Glucose-Capillary: 144 mg/dL — ABNORMAL HIGH (ref 65–99)
Glucose-Capillary: 159 mg/dL — ABNORMAL HIGH (ref 65–99)
Glucose-Capillary: 244 mg/dL — ABNORMAL HIGH (ref 65–99)

## 2014-09-12 LAB — PHOSPHORUS: Phosphorus: 2 mg/dL — ABNORMAL LOW (ref 2.5–4.6)

## 2014-09-12 LAB — BASIC METABOLIC PANEL
ANION GAP: 7 (ref 5–15)
BUN: 7 mg/dL (ref 6–20)
CALCIUM: 7.9 mg/dL — AB (ref 8.9–10.3)
CHLORIDE: 111 mmol/L (ref 101–111)
CO2: 22 mmol/L (ref 22–32)
Creatinine, Ser: 1.31 mg/dL — ABNORMAL HIGH (ref 0.44–1.00)
GFR calc non Af Amer: 48 mL/min — ABNORMAL LOW (ref >60)
GFR, EST AFRICAN AMERICAN: 56 mL/min — AB (ref >60)
Glucose, Bld: 159 mg/dL — ABNORMAL HIGH (ref 65–99)
Potassium: 3 mmol/L — ABNORMAL LOW (ref 3.5–5.1)
Sodium: 140 mmol/L (ref 135–145)

## 2014-09-12 LAB — MAGNESIUM: MAGNESIUM: 2.1 mg/dL (ref 1.7–2.4)

## 2014-09-12 LAB — PROLACTIN: PROLACTIN: 82.5 ng/mL — AB (ref 4.8–23.3)

## 2014-09-12 MED ORDER — POTASSIUM CHLORIDE CRYS ER 20 MEQ PO TBCR
40.0000 meq | EXTENDED_RELEASE_TABLET | Freq: Three times a day (TID) | ORAL | Status: AC
  Administered 2014-09-12 (×2): 40 meq via ORAL
  Filled 2014-09-12 (×2): qty 2

## 2014-09-12 MED ORDER — DEXTROSE 5 % IV SOLN
30.0000 mmol | Freq: Once | INTRAVENOUS | Status: AC
  Administered 2014-09-12: 30 mmol via INTRAVENOUS
  Filled 2014-09-12: qty 10

## 2014-09-12 MED ORDER — LABETALOL HCL 5 MG/ML IV SOLN
10.0000 mg | INTRAVENOUS | Status: DC | PRN
  Filled 2014-09-12: qty 4

## 2014-09-12 NOTE — Progress Notes (Signed)
eLink Physician-Brief Progress Note Patient Name: Lauren DoughtyStephanie A Spinner DOB: 01-08-1971 MRN: [id number]   Date of Service  09/12/2014  HPI/Events of Note  htn  eICU Interventions  Resume home coreg Labetalol prn     Intervention Category Intermediate Interventions: Hypertension - evaluation and management  ALVA,RAKESH V. 09/12/2014, 12:00 AM

## 2014-09-12 NOTE — Progress Notes (Signed)
PULMONARY / CRITICAL CARE MEDICINE   Name: Lauren Steele MRN: [id number] DOB: 04/30/1970    ADMISSION DATE:  09/10/2014 CONSULTATION DATE:  6/26  REFERRING MD :  Roda Shutters  CHIEF COMPLAINT:  Agitated delirium   INITIAL PRESENTATION:  44 year old female w/ h/o DM, HTN and medical non-compliance. Admitted on 6/27 after witnessed seizure-like event. Dx eval in ER c/w DKA. Was to be admitted w/ working dx of DKA and r/o seizure. Very agitated and combative at times so felt to require ICU level care. PCCM asked to admit  STUDIES:  EEG 6/27>>>  SIGNIFICANT EVENTS:  HISTORY OF PRESENT ILLNESS:   44 year old female w/ history of insulin dependent diabetes and medical non-compliance admitted on 6/27 after neighbor witnessed seizure-like episode. On arrival to the ED she was agitated and confused. On arrival blood glucose measured as "high", lactic acid was 4.48, anion gap was 19, beta-hydroxybuyric acid was +.  She was to be admitted w/ working dx of DKA and possible seizure. Had another witnessed seizure by EDP lasting 30 seconds. She was given IV ativan for this. Was combative and confused after. Received several doses of ativan and haldol due to agitation and combativeness. Lactic acid cleared to 1.4. PCCM was asked to assume care as it was felt that based on the pt's level of agitation she would need ICU level monitoring. Just prior to PCCM arrival had several beat run of VT. On arrival she was slow to arouse. Once awake was not cooperative, and then would doze back to sleep, glucose down to 236, AG down to 13. PCCM asked to admit   VITAL SIGNS: Temp:  [98.1 F (36.7 C)-99.1 F (37.3 C)] 98.7 F (37.1 C) (06/28 0733) Pulse Rate:  [80-119] 89 (06/28 1000) Resp:  [15-30] 16 (06/28 1000) BP: (115-189)/(72-124) 115/83 mmHg (06/28 1000) SpO2:  [92 %-100 %] 100 % (06/28 1000) Weight:  [82.7 kg (182 lb 5.1 oz)] 82.7 kg (182 lb 5.1 oz) (06/27 1032) HEMODYNAMICS:   VENTILATOR SETTINGS:   INTAKE  / OUTPUT:  Intake/Output Summary (Last 24 hours) at 09/12/14 1030 Last data filed at 09/12/14 1000  Gross per 24 hour  Intake 3202.4 ml  Output   1583 ml  Net 1619.4 ml   PHYSICAL EXAMINATION: General:  44 year old female, resting quietly now in stretcher  Neuro:  Opens eyes to gently physical stimuli, moves all ext, currently not verbal, will not f/c HEENT:  Mucous membranes are dry, tongue is dry and crusted. Neck veins are flat  Cardiovascular:  rrr Lungs:  Clear  Abdomen:  Soft, + bowel sounds no OM  Musculoskeletal:  Intact  Skin:  Dry, warm and intact   LABS:  CBC  Recent Labs Lab 09/10/14 2325  WBC 7.7  HGB 12.6  HCT 38.3  PLT 393   Coag's No results for input(s): APTT, INR in the last 168 hours.   BMET  Recent Labs Lab 09/11/14 1156 09/11/14 1334 09/11/14 1606  NA 146* 145 144  K 2.9* 2.8* 2.8*  CL 112* 113* 111  CO2 BUN CREATININE 1.60* 1.45* 1.46*  GLUCOSE 128* 102* 113*   Electrolytes  Recent Labs Lab 09/11/14 1156 09/11/14 1334 09/11/14 1606  CALCIUM 8.5* 8.2* 8.1*   Sepsis Markers  Recent Labs Lab 09/10/14 2344 09/11/14 0248  LATICACIDVEN 4.48* 1.4   ABG  Recent Labs Lab 09/11/14 0219  PHART 7.355  PCO2ART 43.9  PO2ART 86.0  Liver Enzymes  Recent Labs Lab 09/10/14 2325  AST 20  ALT 16  ALKPHOS 103  BILITOT 0.8  ALBUMIN 2.3*   Cardiac Enzymes No results for input(s): TROPONINI, PROBNP in the last 168 hours. Glucose  Recent Labs Lab 09/11/14 1735 09/11/14 1911 09/11/14 2017 09/11/14 2357 09/12/14 0426 09/12/14 0733  GLUCAP 137* 159* 170* 144* 157* 152*   Imaging I reviewed CXR myself, no acute disease  ASSESSMENT / PLAN:  PULMONARY OETT A: Aspiration risk P:   Diabetic diet. Titrate O2 down to off. IS per RT protocol.  CARDIOVASCULAR CVL A:  HTN ST VT  P:  D/C tele  Change labetalol to coreg at home dose KVO IVF  RENAL A:   +AGMA: lactic acid AND DKA: suspect  lactic acid was d/t seizure type activity  AKI Severe hypokalemia  P:   KVO IVF Replace K aggressively  Strict I&O Check Mg and Phos now and in AM. BMET in AM.  GASTROINTESTINAL A:   No acute  P:   Diabetic diet.  HEMATOLOGIC A:   No acute. Suspect however h&h will drop w/ hydration  P:  Trend CBC Cont Nixon heparin   INFECTIOUS A:  No acute  P:   BCx2 6/27>>> UC  6/27>>>  F/U on cultures. Hold off abx for now.  ENDOCRINE A:   DKA w/ medical non-compliance  P:   DKA protocol complete. KVO IVF Diabetic educator consulted  NEUROLOGIC A:   Acute encephalopathy in setting of DKA and probably post-ictal state  Seizure activity: neuro called and advised to monitor given metabolic derangements  P:   RASS goal: 0 Seizure precautions EEG No more sedation  Supportive care   TODAY'S SUMMARY: DKA + possible seizure w/ severe encephalopathy which is likely a mix of her DKA and post-ictal state.  Mental status much improved, [facility], PCCM off as off 6/29.   Alyson ReedyWesam G. Yacoub, M.D. [facility]. Pager: [phone]. After hours pager: [phone].  09/12/2014, 10:30 AM

## 2014-09-12 NOTE — Progress Notes (Addendum)
Inpatient Diabetes Program Recommendations  AACE/ADA: New Consensus Statement on Inpatient Glycemic Control (2013)  Target Ranges:  Prepandial:   less than 140 mg/dL      Peak postprandial:   less than 180 mg/dL (1-2 hours)      Critically ill patients:  140 - 180 mg/dL   Results for Lauren Steele, Lauren Steele (MRN [id number]) as of 09/12/2014 11:03  Ref. Range 09/10/2014 23:25  Sodium Latest Ref Range: 135-145 mmol/L 128 (L)  Potassium Latest Ref Range: 3.5-5.1 mmol/L 2.7 (LL)  Chloride Latest Ref Range: 101-111 mmol/L 91 (L)  CO2 Latest Ref Range: 22-32 mmol/L 18 (L)  BUN Latest Ref Range: 6-20 mg/dL 23 (H)  Creatinine Latest Ref Range: 0.44-1.00 mg/dL 2.26 (H)  Calcium Latest Ref Range: 8.9-10.3 mg/dL 7.8 (L)  EGFR (Non-African Amer.) Latest Ref Range: >60 mL/min 25 (L)  EGFR (African American) Latest Ref Range: >60 mL/min 29 (L)  Glucose Latest Ref Range: 65-99 mg/dL 994 (HH)  Anion gap Latest Ref Range: 5-15  19 (H)    Admit with: DKA/ Seizure  History: DM (unsure if Type 1 or Type 2), HTN, CHF  Home DM Meds: Lantus 20 units QHS  Current DM Orders: Novolog Sensitive SSI (0-9 units) Q4 hours      **Patient currently receiving D5 1/2 NS at 75cc/hr.  **IV Insulin drip stopped last night at 7PM.  Novolog Sensitive SSI (0-9 units) Q4 hours started at 9PM last evening.  **CBGs since MN: 144/ 157/ 152 mg/dl.   MD- Patient may require the addition of her home dose of Lantus at some point soon.  Please address basal insulin requirements if CBGs become elevated today.   Addendum 1112: Called RN caring for patient.  Asked RN to please address patient's basal insulin with MD if CBGs become elevated today.     Will follow Wyn Quaker RN, MSN, CDE Diabetes Coordinator Inpatient Glycemic Control Team Team Pager: [phone] (8a-5p)

## 2014-09-12 NOTE — Progress Notes (Signed)
Pt ambulated in hallway 100+ feet. Tolerated well. Dondra SpryMoore, Mari[place]aluz [place], RN

## 2014-09-12 NOTE — Evaluation (Signed)
Physical Therapy Evaluation Patient Details Name: Lauren DoughtyStephanie A Steele MRN: [id number] DOB: 1970/10/25 Today's Date: 09/12/2014   History of Present Illness  pt presents with DKA and r/o Seizures.  pt with hx of DM.    Clinical Impression  Pt moving well, just guarded and cautious.  Encouraged pt to continue to ambulate with Nsg staff throughout the day to increase activity tolerance and A with returning to baseline.  Will check back if pt remains on acute.      Follow Up Recommendations No PT follow up;Supervision - Intermittent    Equipment Recommendations  None recommended by PT    Recommendations for Other Services       Precautions / Restrictions Precautions Precautions: None Restrictions Weight Bearing Restrictions: No      Mobility  Bed Mobility               General bed mobility comments: pt sitting EOB.  Transfers Overall transfer level: Needs assistance Equipment used: None Transfers: Sit to/from Stand Sit to Stand: Supervision         General transfer comment: pt indicates slight dizziness on initial standing, but that it goes away quickly.    Ambulation/Gait Ambulation/Gait assistance: Supervision Ambulation Distance (Feet): 200 Feet Assistive device: None Gait Pattern/deviations: Step-through pattern;Decreased stride length     General Gait Details: pt moves slowly and cautiously.  pt indicating she just does feel as strong as normal.  No balance deficits noted and pt with good awareness of safety.    Stairs            Wheelchair Mobility    Modified Rankin (Stroke Patients Only)       Balance Overall balance assessment: No apparent balance deficits (not formally assessed)                                           Pertinent Vitals/Pain Pain Assessment: No/denies pain    Home Living Family/patient expects to be discharged to:: Private residence Living Arrangements: Spouse/significant  other;Children Available Help at Discharge: Family;Available 24 hours/day Type of Home: House Home Access: Stairs to enter Entrance Stairs-Rails: None Entrance Stairs-Number of Steps: 1 Home Layout: One level Home Equipment: None      Prior Function Level of Independence: Independent               Hand Dominance        Extremity/Trunk Assessment   Upper Extremity Assessment: Overall WFL for tasks assessed           Lower Extremity Assessment: Overall WFL for tasks assessed      Cervical / Trunk Assessment: Normal  Communication   Communication: No difficulties  Cognition Arousal/Alertness: Awake/alert Behavior During Therapy: WFL for tasks assessed/performed Overall Cognitive Status: Within Functional Limits for tasks assessed                      General Comments      Exercises        Assessment/Plan    PT Assessment Patient needs continued PT services  PT Diagnosis Difficulty walking   PT Problem List Decreased activity tolerance;Decreased balance;Decreased mobility  PT Treatment Interventions Gait training;Stair training;Functional mobility training;Therapeutic activities;Therapeutic exercise;Balance training;Patient/family education   PT Goals (Current goals can be found in the Care Plan section) Acute Rehab PT Goals Patient Stated Goal: Back to normal PT Goal Formulation: With  patient Time For Goal Achievement: 09/19/14 Potential to Achieve Goals: Good    Frequency Min 3X/week   Barriers to discharge        Co-evaluation               End of Session   Activity Tolerance: Patient tolerated treatment well Patient left: in bed;with call bell/phone within reach;with family/visitor present (Sitting EOB) Nurse Communication: Mobility status         Time: 4098-1191 PT Time Calculation (min) (ACUTE ONLY): 14 min   Charges:   PT Evaluation $Initial PT Evaluation Tier I: 1 Procedure     PT G CodesSunny Schlein, [place] [phone] 09/12/2014, 3:24 PM

## 2014-09-12 NOTE — Plan of Care (Signed)
Problem: Phase I Progression Outcomes Goal: Initial discharge plan identified Outcome: Completed/Met Date Met:  09/12/14 Return home with home family

## 2014-09-12 NOTE — Plan of Care (Signed)
Problem: Phase I Progression Outcomes Goal: Voiding-avoid urinary catheter unless indicated Outcome: Completed/Met Date Met:  09/12/14 Patient able to void about 4 hours after urinary catheter discontinued. Will continue to monitor.

## 2014-09-13 DIAGNOSIS — E1101 Type 2 diabetes mellitus with hyperosmolarity with coma: Secondary | ICD-10-CM

## 2014-09-13 LAB — CBC
HCT: 31.7 % — ABNORMAL LOW (ref 36.0–46.0)
Hemoglobin: 10.5 g/dL — ABNORMAL LOW (ref 12.0–15.0)
MCH: 27.7 pg (ref 26.0–34.0)
MCHC: 33.1 g/dL (ref 30.0–36.0)
MCV: 83.6 fL (ref 78.0–100.0)
Platelets: 282 10*3/uL (ref 150–400)
RBC: 3.79 MIL/uL — ABNORMAL LOW (ref 3.87–5.11)
RDW: 12.9 % (ref 11.5–15.5)
WBC: 6.2 10*3/uL (ref 4.0–10.5)

## 2014-09-13 LAB — BASIC METABOLIC PANEL
Anion gap: 8 (ref 5–15)
BUN: 10 mg/dL (ref 6–20)
CALCIUM: 7.9 mg/dL — AB (ref 8.9–10.3)
CHLORIDE: 110 mmol/L (ref 101–111)
CO2: 21 mmol/L — AB (ref 22–32)
CREATININE: 1.32 mg/dL — AB (ref 0.44–1.00)
GFR calc Af Amer: 55 mL/min — ABNORMAL LOW (ref >60)
GFR calc non Af Amer: 48 mL/min — ABNORMAL LOW (ref >60)
Glucose, Bld: 179 mg/dL — ABNORMAL HIGH (ref 65–99)
Potassium: 3.5 mmol/L (ref 3.5–5.1)
SODIUM: 139 mmol/L (ref 135–145)

## 2014-09-13 LAB — GLUCOSE, CAPILLARY
GLUCOSE-CAPILLARY: 383 mg/dL — AB (ref 65–99)
Glucose-Capillary: 173 mg/dL — ABNORMAL HIGH (ref 65–99)
Glucose-Capillary: 191 mg/dL — ABNORMAL HIGH (ref 65–99)
Glucose-Capillary: 257 mg/dL — ABNORMAL HIGH (ref 65–99)

## 2014-09-13 LAB — MAGNESIUM: Magnesium: 1.9 mg/dL (ref 1.7–2.4)

## 2014-09-13 LAB — PHOSPHORUS: PHOSPHORUS: 2.9 mg/dL (ref 2.5–4.6)

## 2014-09-13 MED ORDER — INSULIN GLARGINE 100 UNIT/ML ~~LOC~~ SOLN: 20.0000 [IU] | Freq: Every day | SUBCUTANEOUS | Status: DC

## 2014-09-13 MED ORDER — INSULIN ASPART 100 UNIT/ML ~~LOC~~ SOLN
0.0000 [IU] | Freq: Three times a day (TID) | SUBCUTANEOUS | Status: DC
  Administered 2014-09-13: 9 [IU] via SUBCUTANEOUS

## 2014-09-13 MED ORDER — INSULIN ASPART 100 UNIT/ML ~~LOC~~ SOLN: 0.0000 [IU] | Freq: Three times a day (TID) | SUBCUTANEOUS | Status: DC

## 2014-09-13 MED ORDER — INSULIN GLARGINE 100 UNIT/ML ~~LOC~~ SOLN
20.0000 [IU] | Freq: Every day | SUBCUTANEOUS | Status: DC
  Administered 2014-09-13: 20 [IU] via SUBCUTANEOUS
  Filled 2014-09-13: qty 0.2

## 2014-09-13 MED ORDER — CARVEDILOL 6.25 MG PO TABS: 6.2500 mg | ORAL_TABLET | Freq: Two times a day (BID) | ORAL | Status: DC

## 2014-09-13 NOTE — Plan of Care (Signed)
Problem: Food- and Nutrition-Related Knowledge Deficit (NB-1.1) Goal: Nutrition education Formal process to instruct or train a patient/client in a skill or to impart knowledge to help patients/clients voluntarily manage or modify food choices and eating behavior to maintain or improve health. Outcome: Completed/Met Date Met:  09/13/14  RD consulted for nutrition education regarding diabetes as well as a low sodium diet in regards to hypertension.     Lab Results  Component Value Date    HGBA1C 8.3* 08/11/2011    RD provided "Carbohydrate Counting for People with Diabetes" handout from the Academy of Nutrition and Dietetics. Discussed different food groups and their effects on blood sugar, emphasizing carbohydrate-containing foods. Provided list of carbohydrates and recommended serving sizes of common foods.  Discussed importance of controlled and consistent carbohydrate intake throughout the day. Recommended 4 servings of carbohydrates at each meal. Emphasized adequate protein intake. Provided examples of ways to balance meals/snacks and encouraged intake of high-fiber, whole grain complex carbohydrates. Discussed diabetic friendly drink options.   RD additionally provided "Low Sodium Nutrition Therapy" handout from the Academy of Nutrition and Dietetics. Provided examples on ways to decrease sodium intake in diet. Discouraged intake of processed foods and use of salt shaker. Encouraged fresh fruits and vegetables    Expect good compliance.  Body mass index is 29.06 kg/(m^2). Pt meets criteria for overweight based on current BMI.  Pt to be discharged today. No further nutrition interventions warranted at this time. RD contact information provided. If additional nutrition issues arise, please re-consult RD.  Corrin Parker, MS, RD, LDN Pager # [phone] After hours/ weekend pager # [phone]

## 2014-09-13 NOTE — Progress Notes (Signed)
Physical Therapy Treatment and Discharge Patient Details Name: SONDRA BLIXT MRN: [id number] DOB: 1970/07/26 Today's Date: 09/13/2014    History of Present Illness pt presents with DKA and r/o Seizures.  pt with hx of DM.      PT Comments    Walking well; feeling much better; Majority of PT goals met; will dc acute PT  Follow Up Recommendations  No PT follow up;Supervision - Intermittent     Equipment Recommendations  None recommended by PT    Recommendations for Other Services       Precautions / Restrictions Precautions Precautions: None Restrictions Weight Bearing Restrictions: No    Mobility  Bed Mobility               General bed mobility comments: pt sitting EOB.  Transfers Overall transfer level: Modified independent   Transfers: Sit to/from Stand Sit to Stand: Modified independent (Device/Increase time)            Ambulation/Gait Ambulation/Gait assistance: Independent Ambulation Distance (Feet): 600 Feet Assistive device: None Gait Pattern/deviations: WFL(Within Functional Limits) Gait velocity: WNL       Stairs            Wheelchair Mobility    Modified Rankin (Stroke Patients Only)       Balance Overall balance assessment: No apparent balance deficits (not formally assessed)                                  Cognition Arousal/Alertness: Awake/alert Behavior During Therapy: WFL for tasks assessed/performed Overall Cognitive Status: Within Functional Limits for tasks assessed                      Exercises      General Comments General comments (skin integrity, edema, etc.): We discussed the importance of self-care, tight blood sugar control, and taking control over your health      Pertinent Vitals/Pain Pain Assessment: No/denies pain    Home Living                      Prior Function            PT Goals (current goals can now be found in the care plan section) Acute  Rehab PT Goals Patient Stated Goal: Back to normal Progress towards PT goals: Goals met/education completed, patient discharged from PT    Frequency       PT Plan Current plan remains appropriate    Co-evaluation             End of Session   Activity Tolerance: Patient tolerated treatment well Patient left: Other (comment) (independently walking in room)     Time: 2951-8841 PT Time Calculation (min) (ACUTE ONLY): 18 min  Charges:  $Gait Training: 8-22 mins                    G Codes:      Roney Marion Hamff 09/13/2014, 1:12 PM  Roney Marion, [place] Pager [phone] Office [phone]

## 2014-09-13 NOTE — Care Management Note (Signed)
Case Management Note  Patient Details  Name: Lauren Steele MRN: [id number] Date of Birth: March 31, 1970  Subjective/Objective:           CM following for progression and d/c planning.         Action/Plan: 09/13/2014 Met with pt per MD request re medications and her insurance . This pt is knowledgable re her meds and insurance and will purchase her new prescriptions with her insurance plan at a [facility] pharmacy. She had experienced delays with her insurance provider mail order plan which she is required to use for meds that she is on over 3 months. She also plans to contact the insurance advocate at her place of employment. She is employed in the [employer] and states that she has noted other employees having simular issues. She is aware of the need to obtain new meds at the time of d/c and states that she will have not difficulty obtaining these.   Expected Discharge Date:     09/13/2014             Expected Discharge Plan:  Home/Self Care  In-House Referral:  NA  Discharge planning Services  CM Consult  Post Acute Care Choice:  NA Choice offered to:  NA  DME Arranged:    DME Agency:     HH Arranged:    HH Agency:     Status of Service:  Completed, signed off  Medicare Important Message Given:    Date Medicare IM Given:    Medicare IM give by:    Date Additional Medicare IM Given:    Additional Medicare Important Message give by:     If discussed at [place] of Stay Meetings, dates discussed:    Additional Comments:  Adron Bene, RN 09/13/2014, 11:07 AM

## 2014-09-13 NOTE — Progress Notes (Signed)
Pt provided with discharge instruction including information on follow up appointments and new medications. Pt instructed on how to take Lantus and Novolog insulin. Pt verbalized understanding of all information. Pt states she knows where to pick up prescriptions. Both IVs were DC'd without complication. VSS. Pt escorted out by family and refused wheelchair.   Carrie MewJasmine Etienne Mowers, RN

## 2014-09-13 NOTE — Discharge Summary (Signed)
Lauren Steele, is a 44 y.o. female  DOB 09-Feb-1971  MRN [id number].  Admission date:  09/10/2014  Admitting Physician  Alyson ReedyWesam G Yacoub, MD  Discharge Date:  09/13/2014   Primary MD  Lilyan PuntScott Luking, MD  Recommendations for primary care physician for things to follow:   Monitor glycemic control, one-time outpatient neurology follow-up can be considered.   Admission Diagnosis  AKI (acute kidney injury) [N17.9] New onset seizure [R56.9] Diabetic ketoacidosis without coma associated with type 2 diabetes mellitus [E13.10]   Discharge Diagnosis  AKI (acute kidney injury) [N17.9] New onset seizure [R56.9] Diabetic ketoacidosis without coma associated with type 2 diabetes mellitus [E13.10]    Principal Problem:   DKA (diabetic ketoacidoses) Active Problems:   Hypokalemia   Type II diabetes mellitus with complication, uncontrolled   HTN (hypertension)   Cardiomyopathy   Seizure   AKI (acute kidney injury)   Agitation   Hyperosmolar non-ketotic state in patient with type 2 diabetes mellitus   Acute encephalopathy      Past Medical History  Diagnosis Date  . Hypertension   . Diabetes mellitus   . 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 Noncompliance   . MRSA infection 06/02/2011  . Cardiomyopathy 08/12/2011    Ejection fraction 20-25%.  . Acute systolic CHF (congestive heart failure) 08/08/2011    New diagnosis.  . Abnormal thyroid function test 08/13/2011    Slightly low TSH, slightly low free T3, and slightly elevated free T4.  . 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    Grade 2  . Biliary dyskinesia 03/2010.    Gallbladder ejection fraction 22%.    History reviewed. No pertinent past surgical history.     HPI  from the history and physical done on the day of admission:    Lauren Steele is a 44 y.o.  female with Past medical history of diabetes mellitus, noncompliance with medication, hypertension, cardiomyopathy, morbid obesity, combined CHF. The patient is presenting with complaints of seizure-like episode. Patient's neighbor witnessed seizure-like event while the patient was outside of her house. She did not have any tongue bite did not have any loss of control of bowel or bladder. The patient was not oriented on arrival but later on was able to provide history. While in the ER she had another episode of seizure requiring 6 mg of Ativan. After that the patient was drowsy and was referred for admission. Patient has not been taking her medication since last one and a half month. Significant other denies any history of alcohol abuse or any drug abuse.  There was no fall, trauma or injury reported.  The patient is coming from home. At her baseline ambulates without any support, and is independent for most of her ADL manages her medication on her own.     Hospital Course:   1. Mild DKA in a patient with type 2 diabetes mellitus. This was due to patient running out of her medications for about 7-8 days, she apparently has a mail order pharmacy and has been  having issues getting her medications filled. As part of her metabolic abnormality she had a seizure in the ER, neurology was consulted who recommended supportive care for DKA and no further studies. Unremarkable EEG. Her A1c is pending. She was treated appropriately for DKA with IV insulin protocol along with IV fluids. Her gap has closed she was transitioned to Lantus and sliding scale. She has been provided with prescriptions for Lantus along with short-acting pre-meal insulin, she has also been requested to contact her PCP for long-term prescription needs.   2. Essential hypertension. Continued on Coreg, prescription provided.    3. Seizure due to metabolic derangement. EEG unremarkable, neurology consulted over the phone, with  supportive care and no further seizure-like activity no medications. Seizure counseling given in writing.    4. Chronic systolic and diastolic heart failure. Compensated, continue Coreg at home dose diuretic, EF of 25% on echocardiogram done in 2013. She follows with PCP, not on ACE/ARB but on Coreg and Aldactone. We'll recommend PCP to kindly monitor the need of ACE/ARB. Patient says she might have had an allergy to that.     Discharge Condition: Stable  Follow UP  Follow-up Information    Follow up with Lilyan Punt, MD. Schedule an appointment as soon as possible for a visit in 1 week.   Specialty:  Family Medicine   Contact information:   [address] [place] [address] [phone]        Consults obtained - neurology over the phone, critical care  Diet and Activity recommendation: See Discharge Instructions below  Discharge Instructions       Discharge Instructions    Discharge instructions    Complete by:  As directed   Do not drive, operating heavy machinery, perform activities at heights, swimming or participation in water activities or provide baby sitting services until you have seen by Primary MD or a Neurologist and advised to do so again.  Follow with Primary MD Lilyan Punt, MD in 7 days   Get CBC, CMP, 2 view Chest X ray checked  by Primary MD next visit.    Activity: As tolerated with Full fall precautions use walker/cane & assistance as needed   Disposition Home     Diet: Heart Healthy Low Carb.  Accuchecks 4 times/day, Once in AM empty stomach and then before each meal. Log in all results and show them to your Prim.MD in 3 days. If any glucose reading is under 80 or above 300 call your Prim MD immidiately. Follow Low glucose instructions for glucose under 80 as instructed.   For Heart failure patients - Check your Weight same time everyday, if you gain over 2 pounds, or you develop in leg swelling, experience more shortness of  breath or chest pain, call your Primary MD immediately. Follow Cardiac Low Salt Diet and 1.5 lit/day fluid restriction.   On your next visit with your primary care physician please Get Medicines reviewed and adjusted.   Please request your Prim.MD to go over all Hospital Tests and Procedure/Radiological results at the follow up, please get all Hospital records sent to your Prim MD by signing hospital release before you go home.   If you experience worsening of your admission symptoms, develop shortness of breath, life threatening emergency, suicidal or homicidal thoughts you must seek medical attention immediately by calling 911 or calling your MD immediately  if symptoms less severe.  You Must read complete instructions/literature along with all the possible adverse reactions/side effects for  all the Medicines you take and that have been prescribed to you. Take any new Medicines after you have completely understood and accpet all the possible adverse reactions/side effects.   Do not drive when taking Pain medications.    Do not take more than prescribed Pain, Sleep and Anxiety Medications  Special Instructions: If you have smoked or chewed Tobacco  in the last 2 yrs please stop smoking, stop any regular Alcohol  and or any Recreational drug use.  Wear Seat belts while driving.   Please note  You were cared for by a hospitalist during your hospital stay. If you have any questions about your discharge medications or the care you received while you were in the hospital after you are discharged, you can call the unit and asked to speak with the hospitalist on call if the hospitalist that took care of you is not available. Once you are discharged, your primary care physician will handle any further medical issues. Please note that NO REFILLS for any discharge medications will be authorized once you are discharged, as it is imperative that you return to your primary care physician (or establish a  relationship with a primary care physician if you do not have one) for your aftercare needs so that they can reassess your need for medications and monitor your lab values.     Increase activity slowly    Complete by:  As directed              Discharge Medications       Medication List    TAKE these medications        carvedilol 6.25 MG tablet  Commonly known as:  COREG  Take 1 tablet (6.25 mg total) by mouth 2 (two) times daily with a meal. For your blood pressure and heart.     furosemide 40 MG tablet  Commonly known as:  LASIX  Take 1 tablet (40 mg total) by mouth daily. For treatment of your heart.     insulin aspart 100 UNIT/ML injection  Commonly known as:  novoLOG  Inject 0-10 Units into the skin 3 (three) times daily with meals. Before each meal 3 times a day, 140-199 - 2 units, 200-250 - 4 units, 251-299 - 6 units,  300-349 - 8 units,  350 or above 10 units. Insulin PEN if approved, provide syringes and needles if needed. DX E11.65. Can substitute.     insulin glargine 100 UNIT/ML injection  Commonly known as:  LANTUS  Inject 0.2 mLs (20 Units total) into the skin at bedtime. 20 units SQ QHS. Insulin PEN if approved, provide syringes and needles if needed. Dx - E11.65     omeprazole 20 MG capsule  Commonly known as:  PRILOSEC  Take 20 mg by mouth daily.     potassium chloride 10 MEQ tablet  Commonly known as:  K-DUR  Take 1 tablet (10 mEq total) by mouth 2 (two) times daily.     promethazine 12.5 MG tablet  Commonly known as:  PHENERGAN  Take 1 tablet (12.5 mg total) by mouth every 6 (six) hours as needed for nausea.     spironolactone 12.5 mg Tabs tablet  Commonly known as:  ALDACTONE  Take 0.5 tablets (12.5 mg total) by mouth 2 (two) times daily. For your heart.        Major procedures and Radiology Reports - PLEASE review detailed and final reports for all details, in brief -     EEG Abnormalities: None  Clinical Interpretation: This normal EEG is  recorded in the waking and sleep state. There was no seizure or seizure predisposition recorded on this study.   Ct Head Wo Contrast  09/11/2014   CLINICAL DATA:  Hyperglycemia.  Seizures today.  EXAM: CT HEAD WITHOUT CONTRAST  TECHNIQUE: Contiguous axial images were obtained from the base of the skull through the vertex without intravenous contrast.  COMPARISON:  None.  FINDINGS: There is no intracranial hemorrhage, mass or evidence of acute infarction. There is mild generalized atrophy. No focal brain abnormality is evident. There is no extra-axial fluid collection. Bones appear unremarkable.  IMPRESSION: Mild generalized atrophy.  No acute findings.   Electronically Signed   By: Ellery Plunk M.D.   On: 09/11/2014 00:24    Micro Results      Recent Results (from the past 240 hour(s))  MRSA PCR Screening     Status: None   Collection Time: 09/11/14 10:29 AM  Result Value Ref Range Status   MRSA by PCR NEGATIVE NEGATIVE Final    Comment:        The GeneXpert MRSA Assay (FDA approved for NASAL specimens only), is one component of a comprehensive MRSA colonization surveillance program. It is not intended to diagnose MRSA infection nor to guide or monitor treatment for MRSA infections.        Today   Subjective    Ariza Evans today has no headache,no chest abdominal pain,no new weakness tingling or numbness, feels much better wants to go home today.    Objective   Blood pressure 114/68, pulse 79, temperature 98.3 F (36.8 C), temperature source Oral, resp. rate 16, height 5\' 7"  (1.702 m), weight 84.188 kg (185 lb 9.6 oz), last menstrual period 09/03/2014, SpO2 97 %.   Intake/Output Summary (Last 24 hours) at 09/13/14 1004 Last data filed at 09/12/14 1811  Gross per 24 hour  Intake 333.75 ml  Output      0 ml  Net 333.75 ml    Exam Awake Alert, Oriented x 3, No new F.N deficits, Normal affect [place].AT,PERRAL Supple Neck,No JVD, No cervical lymphadenopathy  appriciated.  Symmetrical Chest wall movement, Good air movement bilaterally, CTAB RRR,No Gallops,Rubs or new Murmurs, No Parasternal Heave +ve B.Sounds, Abd Soft, Non tender, No organomegaly appriciated, No rebound -guarding or rigidity. No Cyanosis, Clubbing or edema, No new Rash or bruise   Data Review   CBC w Diff: Lab Results  Component Value Date   WBC 6.2 09/13/2014   HGB 10.5* 09/13/2014   HCT 31.7* 09/13/2014   PLT 282 09/13/2014   LYMPHOPCT 8* 08/08/2011   MONOPCT 3 08/08/2011   EOSPCT 0 08/08/2011   BASOPCT 0 08/08/2011    CMP: Lab Results  Component Value Date   NA 139 09/13/2014   K 3.5 09/13/2014   CL 110 09/13/2014   CO2 21* 09/13/2014   BUN 10 09/13/2014   CREATININE 1.32* 09/13/2014   PROT 5.9* 09/10/2014   ALBUMIN 2.3* 09/10/2014   BILITOT 0.8 09/10/2014   ALKPHOS 103 09/10/2014   AST 20 09/10/2014   ALT 16 09/10/2014  .  Lab Results  Component Value Date   HGBA1C 8.3* 08/11/2011    Total Time in preparing paper work, data evaluation and todays exam - 35 minutes  Leroy Sea M.D on 09/13/2014 at 10:04 AM  [facility]   Office  [phone]

## 2014-09-13 NOTE — Progress Notes (Signed)
Inpatient Diabetes Program Recommendations  AACE/ADA: New Consensus Statement on Inpatient Glycemic Control (2013)  Target Ranges:  Prepandial:   less than 140 mg/dL      Peak postprandial:   less than 180 mg/dL (1-2 hours)      Critically ill patients:  140 - 180 mg/dL     Results for Payton DoughtyBULLOCK, Loralee A (MRN [id number]) as of 09/13/2014 09:42  Ref. Range 09/11/2014 23:57 09/12/2014 04:26 09/12/2014 07:33 09/12/2014 11:53 09/12/2014 16:14 09/12/2014 20:02  Glucose-Capillary Latest Ref Range: 65-99 mg/dL 284144 (H) 132157 (H) 440152 (H) 159 (H) 251 (H) 244 (H)    Results for Payton DoughtyBULLOCK, Haniya A (MRN [id number]) as of 09/13/2014 09:42  Ref. Range 09/12/2014 23:53 09/13/2014 04:10 09/13/2014 07:39  Glucose-Capillary Latest Ref Range: 65-99 mg/dL 440257 (H) 347173 (H) 425191 (H)    Admit with: DKA/ Seizure  History: DM, HTN, CHF  Home DM Meds: Lantus 20 units QHS  Current DM Orders: Novolog Sensitive SSI (0-9 units) Q4 hours     MD- Please consider the following:  1. Add Lantus 10 units daily (50% of home dose)  2. Change Novolog Sensitive SSI (0-9 units) to TID AC + HS (currently ordered as Q4 hours)  3. Add low dose Novolog Meal Coverage- Novolog 3 units tid with meals  4. Consider discharging patient home on Novolog SSI in addition to her Lantus dose (patient has taken Novolog in the past)    Will follow Ambrose FinlandJeannine Johnston Tikita Mabee RN, MSN, CDE Diabetes Coordinator Inpatient Glycemic Control Team Team Pager: [phone] (8a-5p)

## 2014-09-13 NOTE — Discharge Instructions (Signed)
Do not drive, operating heavy machinery, perform activities at heights, swimming or participation in water activities or provide baby sitting services until you have seen by Primary MD or a Neurologist and advised to do so again.  Follow with Primary MD Lilyan PuntScott Luking, MD in 7 days   Get CBC, CMP, 2 view Chest X ray checked  by Primary MD next visit.    Activity: As tolerated with Full fall precautions use walker/cane & assistance as needed   Disposition Home     Diet: Heart Healthy Low Carb.  Accuchecks 4 times/day, Once in AM empty stomach and then before each meal. Log in all results and show them to your Prim.MD in 3 days. If any glucose reading is under 80 or above 300 call your Prim MD immidiately. Follow Low glucose instructions for glucose under 80 as instructed.   For Heart failure patients - Check your Weight same time everyday, if you gain over 2 pounds, or you develop in leg swelling, experience more shortness of breath or chest pain, call your Primary MD immediately. Follow Cardiac Low Salt Diet and 1.5 lit/day fluid restriction.   On your next visit with your primary care physician please Get Medicines reviewed and adjusted.   Please request your Prim.MD to go over all Hospital Tests and Procedure/Radiological results at the follow up, please get all Hospital records sent to your Prim MD by signing hospital release before you go home.   If you experience worsening of your admission symptoms, develop shortness of breath, life threatening emergency, suicidal or homicidal thoughts you must seek medical attention immediately by calling 911 or calling your MD immediately  if symptoms less severe.  You Must read complete instructions/literature along with all the possible adverse reactions/side effects for all the Medicines you take and that have been prescribed to you. Take any new Medicines after you have completely understood and accpet all the possible adverse reactions/side  effects.   Do not drive when taking Pain medications.    Do not take more than prescribed Pain, Sleep and Anxiety Medications  Special Instructions: If you have smoked or chewed Tobacco  in the last 2 yrs please stop smoking, stop any regular Alcohol  and or any Recreational drug use.  Wear Seat belts while driving.   Please note  You were cared for by a hospitalist during your hospital stay. If you have any questions about your discharge medications or the care you received while you were in the hospital after you are discharged, you can call the unit and asked to speak with the hospitalist on call if the hospitalist that took care of you is not available. Once you are discharged, your primary care physician will handle any further medical issues. Please note that NO REFILLS for any discharge medications will be authorized once you are discharged, as it is imperative that you return to your primary care physician (or establish a relationship with a primary care physician if you do not have one) for your aftercare needs so that they can reassess your need for medications and monitor your lab values.

## 2014-09-14 LAB — HEMOGLOBIN A1C
Hgb A1c MFr Bld: 16.4 % — ABNORMAL HIGH (ref 4.8–5.6)
Mean Plasma Glucose: 424 mg/dL

## 2014-10-20 ENCOUNTER — Other Ambulatory Visit: Payer: Self-pay | Admitting: Internal Medicine

## 2015-11-03 ENCOUNTER — Encounter (HOSPITAL_COMMUNITY): Payer: Self-pay | Admitting: *Deleted

## 2015-11-03 ENCOUNTER — Inpatient Hospital Stay (HOSPITAL_COMMUNITY)
Admission: EM | Admit: 2015-11-03 | Discharge: 2015-11-07 | DRG: 683 | Disposition: A | Discharge status: Home / Self Care | Payer: BLUE CROSS/BLUE SHIELD | Attending: Internal Medicine | Admitting: Internal Medicine

## 2015-11-03 DIAGNOSIS — K529 Noninfective gastroenteritis and colitis, unspecified: Secondary | ICD-10-CM | POA: Diagnosis present

## 2015-11-03 DIAGNOSIS — Z794 Long term (current) use of insulin: Secondary | ICD-10-CM

## 2015-11-03 DIAGNOSIS — I160 Hypertensive urgency: Secondary | ICD-10-CM | POA: Diagnosis present

## 2015-11-03 DIAGNOSIS — I10 Essential (primary) hypertension: Secondary | ICD-10-CM | POA: Diagnosis present

## 2015-11-03 DIAGNOSIS — Z841 Family history of disorders of kidney and ureter: Secondary | ICD-10-CM

## 2015-11-03 DIAGNOSIS — Z8614 Personal history of Methicillin resistant Staphylococcus aureus infection: Secondary | ICD-10-CM | POA: Diagnosis not present

## 2015-11-03 DIAGNOSIS — E1165 Type 2 diabetes mellitus with hyperglycemia: Secondary | ICD-10-CM | POA: Diagnosis present

## 2015-11-03 DIAGNOSIS — E876 Hypokalemia: Secondary | ICD-10-CM | POA: Diagnosis present

## 2015-11-03 DIAGNOSIS — G43A0 Cyclical vomiting, not intractable: Secondary | ICD-10-CM | POA: Diagnosis not present

## 2015-11-03 DIAGNOSIS — N179 Acute kidney failure, unspecified: Secondary | ICD-10-CM | POA: Diagnosis not present

## 2015-11-03 DIAGNOSIS — R109 Unspecified abdominal pain: Secondary | ICD-10-CM | POA: Diagnosis not present

## 2015-11-03 DIAGNOSIS — IMO0002 Reserved for concepts with insufficient information to code with codable children: Secondary | ICD-10-CM

## 2015-11-03 DIAGNOSIS — E860 Dehydration: Secondary | ICD-10-CM | POA: Diagnosis present

## 2015-11-03 DIAGNOSIS — Z87891 Personal history of nicotine dependence: Secondary | ICD-10-CM | POA: Diagnosis not present

## 2015-11-03 DIAGNOSIS — Z833 Family history of diabetes mellitus: Secondary | ICD-10-CM | POA: Diagnosis not present

## 2015-11-03 DIAGNOSIS — R112 Nausea with vomiting, unspecified: Secondary | ICD-10-CM

## 2015-11-03 DIAGNOSIS — E118 Type 2 diabetes mellitus with unspecified complications: Secondary | ICD-10-CM | POA: Diagnosis not present

## 2015-11-03 DIAGNOSIS — Z8249 Family history of ischemic heart disease and other diseases of the circulatory system: Secondary | ICD-10-CM | POA: Diagnosis not present

## 2015-11-03 DIAGNOSIS — N19 Unspecified kidney failure: Secondary | ICD-10-CM | POA: Diagnosis present

## 2015-11-03 DIAGNOSIS — I429 Cardiomyopathy, unspecified: Secondary | ICD-10-CM | POA: Diagnosis present

## 2015-11-03 DIAGNOSIS — Z823 Family history of stroke: Secondary | ICD-10-CM

## 2015-11-03 DIAGNOSIS — R1110 Vomiting, unspecified: Secondary | ICD-10-CM

## 2015-11-03 LAB — URINE MICROSCOPIC-ADD ON

## 2015-11-03 LAB — URINALYSIS, ROUTINE W REFLEX MICROSCOPIC
Bilirubin Urine: NEGATIVE
GLUCOSE, UA: 250 mg/dL — AB
LEUKOCYTES UA: NEGATIVE
Nitrite: NEGATIVE
PH: 6 (ref 5.0–8.0)
Protein, ur: >300 mg/dL — AB
SPECIFIC GRAVITY, URINE: 1.02 (ref 1.005–1.030)

## 2015-11-03 LAB — COMPREHENSIVE METABOLIC PANEL
ALT: 32 U/L (ref 14–54)
AST: 36 U/L (ref 15–41)
Albumin: 3.1 g/dL — ABNORMAL LOW (ref 3.5–5.0)
Alkaline Phosphatase: 120 U/L (ref 38–126)
Anion gap: 15 (ref 5–15)
BILIRUBIN TOTAL: 0.9 mg/dL (ref 0.3–1.2)
BUN: 40 mg/dL — AB (ref 6–20)
CHLORIDE: 102 mmol/L (ref 101–111)
CO2: 23 mmol/L (ref 22–32)
Calcium: 8.6 mg/dL — ABNORMAL LOW (ref 8.9–10.3)
Creatinine, Ser: 3.26 mg/dL — ABNORMAL HIGH (ref 0.44–1.00)
GFR calc Af Amer: 18 mL/min — ABNORMAL LOW (ref >60)
GFR calc non Af Amer: 16 mL/min — ABNORMAL LOW (ref >60)
GLUCOSE: 217 mg/dL — AB (ref 65–99)
Potassium: 2.8 mmol/L — ABNORMAL LOW (ref 3.5–5.1)
SODIUM: 140 mmol/L (ref 135–145)
TOTAL PROTEIN: 7.8 g/dL (ref 6.5–8.1)

## 2015-11-03 LAB — CBC WITH DIFFERENTIAL/PLATELET
BASOS PCT: 0 %
Basophils Absolute: 0 10*3/uL (ref 0.0–0.1)
Eosinophils Absolute: 0 10*3/uL (ref 0.0–0.7)
Eosinophils Relative: 0 %
HEMATOCRIT: 43.4 % (ref 36.0–46.0)
HEMOGLOBIN: 14.8 g/dL (ref 12.0–15.0)
LYMPHS ABS: 1.4 10*3/uL (ref 0.7–4.0)
Lymphocytes Relative: 12 %
MCH: 28.8 pg (ref 26.0–34.0)
MCHC: 34.1 g/dL (ref 30.0–36.0)
MCV: 84.4 fL (ref 78.0–100.0)
MONO ABS: 0.9 10*3/uL (ref 0.1–1.0)
Monocytes Relative: 8 %
NEUTROS ABS: 9.2 10*3/uL — AB (ref 1.7–7.7)
NEUTROS PCT: 80 %
Platelets: 474 10*3/uL — ABNORMAL HIGH (ref 150–400)
RBC: 5.14 MIL/uL — ABNORMAL HIGH (ref 3.87–5.11)
RDW: 13.6 % (ref 11.5–15.5)
WBC: 11.5 10*3/uL — ABNORMAL HIGH (ref 4.0–10.5)

## 2015-11-03 LAB — LIPASE, BLOOD: Lipase: 42 U/L (ref 11–51)

## 2015-11-03 LAB — CBG MONITORING, ED: Glucose-Capillary: 217 mg/dL — ABNORMAL HIGH (ref 65–99)

## 2015-11-03 MED ORDER — ONDANSETRON HCL 4 MG/2ML IJ SOLN
4.0000 mg | Freq: Four times a day (QID) | INTRAMUSCULAR | Status: DC | PRN
  Administered 2015-11-03 – 2015-11-06 (×7): 4 mg via INTRAVENOUS
  Filled 2015-11-03 (×8): qty 2

## 2015-11-03 MED ORDER — INSULIN GLARGINE 100 UNIT/ML ~~LOC~~ SOLN
20.0000 [IU] | Freq: Every day | SUBCUTANEOUS | Status: DC
  Administered 2015-11-03 – 2015-11-06 (×3): 20 [IU] via SUBCUTANEOUS
  Filled 2015-11-03 (×5): qty 0.2

## 2015-11-03 MED ORDER — CLONIDINE HCL 0.3 MG/24HR TD PTWK
0.3000 mg | MEDICATED_PATCH | TRANSDERMAL | Status: DC
  Administered 2015-11-03: 0.3 mg via TRANSDERMAL
  Filled 2015-11-03: qty 1

## 2015-11-03 MED ORDER — SODIUM CHLORIDE 0.9 % IV SOLN
INTRAVENOUS | Status: DC
  Administered 2015-11-03 – 2015-11-07 (×6): via INTRAVENOUS

## 2015-11-03 MED ORDER — HYDRALAZINE HCL 20 MG/ML IJ SOLN
10.0000 mg | INTRAMUSCULAR | Status: DC | PRN
  Administered 2015-11-04 – 2015-11-05 (×4): 10 mg via INTRAVENOUS
  Filled 2015-11-03 (×5): qty 1

## 2015-11-03 MED ORDER — INSULIN GLARGINE 100 UNIT/ML ~~LOC~~ SOLN
SUBCUTANEOUS | Status: AC
  Filled 2015-11-03: qty 10

## 2015-11-03 MED ORDER — ENOXAPARIN SODIUM 30 MG/0.3ML ~~LOC~~ SOLN
30.0000 mg | SUBCUTANEOUS | Status: DC
  Administered 2015-11-03: 30 mg via SUBCUTANEOUS
  Filled 2015-11-03: qty 0.3

## 2015-11-03 MED ORDER — PROMETHAZINE HCL 25 MG/ML IJ SOLN
25.0000 mg | Freq: Once | INTRAMUSCULAR | Status: AC
  Administered 2015-11-03: 25 mg via INTRAVENOUS
  Filled 2015-11-03: qty 1

## 2015-11-03 MED ORDER — MORPHINE SULFATE (PF) 2 MG/ML IV SOLN
2.0000 mg | INTRAVENOUS | Status: DC | PRN
  Administered 2015-11-03: 2 mg via INTRAVENOUS
  Filled 2015-11-03: qty 1

## 2015-11-03 MED ORDER — SODIUM CHLORIDE 0.9 % IV BOLUS (SEPSIS)
1000.0000 mL | Freq: Once | INTRAVENOUS | Status: AC
  Administered 2015-11-03: 1000 mL via INTRAVENOUS

## 2015-11-03 MED ORDER — INSULIN ASPART 100 UNIT/ML ~~LOC~~ SOLN: 0.0000 [IU] | Freq: Three times a day (TID) | SUBCUTANEOUS | Status: DC

## 2015-11-03 MED ORDER — ONDANSETRON HCL 4 MG PO TABS
4.0000 mg | ORAL_TABLET | Freq: Four times a day (QID) | ORAL | Status: DC | PRN
  Administered 2015-11-05 – 2015-11-07 (×2): 4 mg via ORAL
  Filled 2015-11-03: qty 1

## 2015-11-03 MED ORDER — HYDRALAZINE HCL 20 MG/ML IJ SOLN
10.0000 mg | Freq: Once | INTRAMUSCULAR | Status: AC
  Administered 2015-11-03: 10 mg via INTRAVENOUS
  Filled 2015-11-03: qty 1

## 2015-11-03 MED ORDER — POTASSIUM CHLORIDE 10 MEQ/100ML IV SOLN
10.0000 meq | INTRAVENOUS | Status: AC
  Administered 2015-11-03 – 2015-11-04 (×3): 10 meq via INTRAVENOUS
  Filled 2015-11-03 (×3): qty 100

## 2015-11-03 MED ORDER — LABETALOL HCL 5 MG/ML IV SOLN
20.0000 mg | Freq: Once | INTRAVENOUS | Status: AC
  Administered 2015-11-03: 20 mg via INTRAVENOUS
  Filled 2015-11-03: qty 4

## 2015-11-03 MED ORDER — ONDANSETRON HCL 4 MG/2ML IJ SOLN
4.0000 mg | Freq: Once | INTRAMUSCULAR | Status: AC
  Administered 2015-11-03: 4 mg via INTRAVENOUS
  Filled 2015-11-03: qty 2

## 2015-11-03 MED ORDER — CLONIDINE HCL 0.2 MG PO TABS
0.3000 mg | ORAL_TABLET | Freq: Once | ORAL | Status: DC
  Filled 2015-11-03: qty 1

## 2015-11-03 MED ORDER — INSULIN GLARGINE 100 UNIT/ML ~~LOC~~ SOLN: 20.0000 [IU] | Freq: Every day | SUBCUTANEOUS | Status: DC

## 2015-11-03 MED ORDER — SODIUM CHLORIDE 0.9 % IV SOLN
INTRAVENOUS | Status: DC
  Filled 2015-11-03 (×4): qty 1000

## 2015-11-03 MED ORDER — DILTIAZEM HCL 60 MG PO TABS
180.0000 mg | ORAL_TABLET | Freq: Once | ORAL | Status: DC
  Filled 2015-11-03: qty 6

## 2015-11-03 NOTE — ED Notes (Signed)
Patient complaining of nausea at this time when sitting up.

## 2015-11-03 NOTE — ED Provider Notes (Signed)
[facility]-EMERGENCY DEPT Provider Note   CSN: [id number] Arrival date & time: 11/03/15  1825     History   Chief Complaint Chief Complaint  Patient presents with  . Emesis    HPI Lauren Steele is a 45 y.o. female.  HPI  Lauren Steele is a 45 y.o. female with PMHx of DM and HTN who presents to the Emergency Department complaining of sudden onset, constant, nausea and vomiting for the last 4 days. She states she has been vomiting multiple times daily. Pt reports her emesis has been yellow tinged but also notes some dry heaving. Pt endorses vomiting with food, fluid, or medication intake. She states she is currently menstruating and it is a normal period. Pt denies sick contacts with similar symptoms. She works at [employer] and has not been able to work since her symptoms presented. She denies abdominal pain, fever, diarrhea, dysuria, increased urinary frequency, or any other associated symptoms.     I have reviewed the triage vital signs and the nursing notes.   Pertinent labs & imaging results that were available during my care of the patient were reviewed by me and considered in my medical decision making (see chart for details).        Clinical Course  Value Comment By Time  Potassium: (!) 2.8 Will replete Margarita Grizzle, MD 08/19 2139  Creatinine: (!) 3.26 Last in system 2016- normal Margarita Grizzle, MD 08/19 2139        1- nausea and vomiting- iv fluids infusing, anitemetics given, patient with decreased vomiting, still nauseated 2- hypokalemia- will replete 3- renal failure- likely secondary to 1, continue hydration 4- hypertension- likely secondary to not tolerating her oral meds, attempting to give po clonidine and cardizem, will give iv labetalol   Discussed with Dr. Sharl Ma and will admit to step down.   Final Clinical Impressions(s) / ED Diagnoses    Final diagnoses:  Intractable vomiting with nausea, vomiting of unspecified type  AKI (acute kidney  injury) (HCC)  Essential hypertension      New Prescriptions  Past Medical History:  Diagnosis Date  . Abnormal thyroid function test 08/13/2011   Slightly low TSH, slightly low free T3, and slightly elevated free T4.  Marland Kitchen Acute systolic CHF (congestive heart failure) (HCC) 08/08/2011   New diagnosis.  . Biliary dyskinesia 03/2010.   Gallbladder ejection fraction 22%.  . Cardiomyopathy 08/12/2011   Ejection fraction 20-25%.  . Diabetes mellitus   . 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 Hypertension   . MRSA infection 06/02/2011  . Noncompliance   . Tachycardia 06/02/2011   Chronic    Patient Active Problem List   Diagnosis Date Noted  . DKA (diabetic ketoacidoses) (HCC) 09/11/2014  . Seizure (HCC) 09/11/2014  . AKI (acute kidney injury) (HCC) 09/11/2014  . Agitation 09/11/2014  . Hyperosmolar non-ketotic state in patient with type 2 diabetes mellitus (HCC) 09/11/2014  . Acute encephalopathy 09/11/2014  . NSVT (nonsustained ventricular tachycardia) (HCC)   . Cardiomyopathy (HCC) 08/13/2011  . Abnormal thyroid function test 08/13/2011  . Ovarian cyst 08/10/2011  . Biliary dyskinesia 08/09/2011  . Acute systolic CHF (congestive heart failure) (HCC) 08/08/2011  . Type II diabetes mellitus with complication, uncontrolled (HCC) 08/08/2011  . HTN (hypertension) 08/08/2011  . UTI (lower urinary tract infection) 08/08/2011  . Abdominal pain 08/08/2011  . Hypokalemia 05/29/2011    History reviewed. No pertinent surgical history.  OB History    No data available  Home Medications    Prior to Admission medications   Medication Sig Start Date End Date Taking? Authorizing Provider  Cinnamon 500 MG capsule Take 500 mg by mouth daily.   Yes Historical Provider, MD  cloNIDine (CATAPRES) 0.3 MG tablet Take 0.3 mg by mouth at bedtime. 10/31/15  Yes Historical Provider, MD  diltiazem (CARDIZEM LA) 180 MG 24 hr tablet Take 180 mg by mouth daily. 10/03/15  Yes Historical  Provider, MD  hydrochlorothiazide (HYDRODIURIL) 25 MG tablet Take 25 mg by mouth daily. 10/02/15  Yes Historical Provider, MD  insulin glargine (LANTUS) 100 UNIT/ML injection Inject 0.2 mLs (20 Units total) into the skin at bedtime. 20 units SQ QHS. Insulin PEN if approved, provide syringes and needles if needed. Dx - E11.65 Patient taking differently: Inject 20 Units into the skin 2 (two) times daily.  09/13/14 12/21/18 Yes Leroy Sea, MD  furosemide (LASIX) 40 MG tablet Take 1 tablet (40 mg total) by mouth daily. For treatment of your heart. 08/13/11 09/11/15  Elliot Cousin, MD  potassium chloride (K-DUR) 10 MEQ tablet Take 1 tablet (10 mEq total) by mouth 2 (two) times daily. 08/13/11 09/11/15  Elliot Cousin, MD  promethazine (PHENERGAN) 12.5 MG tablet Take 1 tablet (12.5 mg total) by mouth every 6 (six) hours as needed for nausea. 08/13/11 09/11/15  Elliot Cousin, MD  spironolactone (ALDACTONE) 12.5 mg TABS Take 0.5 tablets (12.5 mg total) by mouth 2 (two) times daily. For your heart. 08/13/11 09/11/15  Elliot Cousin, MD    Family History Family History  Problem Relation Age of Onset  . Diabetes Mother   . Kidney disease Mother   . Coronary artery disease Mother   . Diabetes Father   . Kidney disease Father   . Stroke Father   . Coronary artery disease Father     CABG    Social History Social History  Substance Use Topics  . Smoking status: Former Smoker    Quit date: 04/18/2011  . Smokeless tobacco: Never Used  . Alcohol use No     Allergies   Review of patient's allergies indicates no known allergies.   Review of Systems Review of Systems  All other systems reviewed and are negative.    Physical Exam Updated Vital Signs BP (!) 216/144   Pulse (!) 122   Temp 99.8 F (37.7 C) (Oral)   Resp 16   Ht 5\' 8"  (1.727 m)   Wt 99.8 kg   LMP 10/31/2015   SpO2 95%   BMI 33.45 kg/m   Physical Exam  Constitutional: She is oriented to person, place, and time. She appears  well-developed and well-nourished.  Morbidly obese female who is actively vomiting  HENT:  Head: Normocephalic and atraumatic.  Right Ear: External ear normal.  Left Ear: External ear normal.  Mouth/Throat: Oropharynx is clear and moist.  Eyes: EOM are normal. Pupils are equal, round, and reactive to light.  Neck: Normal range of motion. Neck supple.  Cardiovascular: Tachycardia present.   Pulmonary/Chest: Effort normal.  Abdominal: Soft. Bowel sounds are normal. She exhibits no distension and no mass. There is no tenderness. There is no guarding.  Musculoskeletal: Normal range of motion. She exhibits no edema or deformity.  Neurological: She is alert and oriented to person, place, and time.  Skin: Skin is warm. Capillary refill takes less than 2 seconds.  Nursing note and vitals reviewed.    ED Treatments / Results  Labs (all labs ordered are listed, but only abnormal results  are displayed) Labs Reviewed  COMPREHENSIVE METABOLIC PANEL - Abnormal; Notable for the following:       Result Value   Potassium 2.8 (*)    Glucose, Bld 217 (*)    BUN 40 (*)    Creatinine, Ser 3.26 (*)    Calcium 8.6 (*)    Albumin 3.1 (*)    GFR calc non Af Amer 16 (*)    GFR calc Af Amer 18 (*)    All other components within normal limits  URINALYSIS, ROUTINE W REFLEX MICROSCOPIC (NOT [facility]) - Abnormal; Notable for the following:    Glucose, UA 250 (*)    Hgb urine dipstick LARGE (*)    Ketones, ur TRACE (*)    Protein, ur >300 (*)    All other components within normal limits  CBC WITH DIFFERENTIAL/PLATELET - Abnormal; Notable for the following:    WBC 11.5 (*)    RBC 5.14 (*)    Platelets 474 (*)    Neutro Abs 9.2 (*)    All other components within normal limits  URINE MICROSCOPIC-ADD ON - Abnormal; Notable for the following:    Squamous Epithelial / LPF 0-5 (*)    Bacteria, UA MANY (*)    All other components within normal limits  CBG MONITORING, ED - Abnormal; Notable for the  following:    Glucose-Capillary 217 (*)    All other components within normal limits  LIPASE, BLOOD    EKG  EKG Interpretation None       Radiology No results found.  Procedures Procedures (including critical care time)  Medications Ordered in ED Medications  promethazine (PHENERGAN) injection 25 mg (not administered)  sodium chloride 0.9 % bolus 1,000 mL (0 mLs Intravenous Stopped 11/03/15 2058)  ondansetron (ZOFRAN) injection 4 mg (4 mg Intravenous Given 11/03/15 1920)  promethazine (PHENERGAN) injection 25 mg (25 mg Intravenous Given 11/03/15 2013)  cloNIDine (CATAPRES) tablet 0.3 mg (0.3 mg Oral Given 11/03/15 2129)  diltiazem (CARDIZEM) tablet 180 mg (180 mg Oral Given 11/03/15 2128)  sodium chloride 0.9 % bolus 1,000 mL (1,000 mLs Intravenous New Bag/Given 11/03/15 2130)  labetalol (NORMODYNE,TRANDATE) injection 20 mg (20 mg Intravenous Given 11/03/15 2129)     Initial Impression / Assessment and Plan / ED Course  I have reviewed the triage vital signs and the nursing notes.  Pertinent labs & imaging results that were available during my care of the patient were reviewed by me and considered in my medical decision making (see chart for details).  Clinical Course  Value Comment By Time  Potassium: (!) 2.8 Will replete Margarita Grizzleanielle Lizett Chowning, MD 08/19 2139  Creatinine: (!) 3.26 Last in system 2016- normal Margarita Grizzleanielle Parminder Trapani, MD 08/19 2139     1- nausea and vomiting- iv fluids infusing, anitemetics given, patient with decreased vomiting, still nauseated 2- hypokalemia- will replete 3- renal failure- likely secondary to 1, continue hydration 4- hypertension- likely secondary to not tolerating her oral meds, attempting to give po clonidine and cardizem, will give iv labetalol  Discussed with Dr. Sharl MaLama and will admit to step down. I personally performed the services described in this documentation, which was scribed in my presence. The recorded information has been reviewed and  considered.  Final Clinical Impressions(s) / ED Diagnoses   Final diagnoses:  Intractable vomiting with nausea, vomiting of unspecified type  AKI (acute kidney injury) [facility](HCC)  Essential hypertension    New Prescriptions New Prescriptions   No medications on file     Margarita Grizzleanielle Kyesha Balla,  MD 11/05/15 1526

## 2015-11-03 NOTE — H&P (Signed)
[facility] H&P   Patient Demographics:    Lauren ShipperStephanie Steele, is a 45 y.o. female  MRN: [id number]  DOB - 30-Jun-1970  Admit Date - 11/03/2015  Outpatient Primary MD for the patient is Karie ChimeraEESE,BETTI D, MD  Referring MD/NP/PA: Dr. Rosalia Hammersay  Patient coming from: Home  Chief Complaint  Patient presents with  . Emesis      HPI:    Lauren ShipperStephanie Langwell  is a 45 y.o. female, With history of diabetes mellitus, hypertension, grade 2 diastolic dysfunction who came to the ED with abdominal pain associated nausea and vomiting for past 2 days. Patient says that she was unable  to keep her medications down today and continued to have vomiting. She denies fever or dysuria. She denies chest pain or shortness of breath.  In the ED lab work revealed acute kidney injury, hypokalemia.     Review of systems:    In addition to the HPI above,  No Fever-chills, No Headache, No changes with Vision or hearing, No problems swallowing food or Liquids, No Chest pain, Cough or Shortness of Breath, No Blood in stool or Urine, No dysuria, No new skin rashes or bruises, No new joints pains-aches,  No new weakness, tingling, numbness in any extremity, No recent weight gain or loss, No polyuria, polydypsia or polyphagia, No significant Mental Stressors.  A full 10 point Review of Systems was done, except as stated above, all other Review of Systems were negative.   With Past History of the following :    Past Medical History:  Diagnosis Date  . Abnormal thyroid function test 08/13/2011   Slightly low TSH, slightly low free T3, and slightly elevated free T4.  Marland Kitchen. Acute systolic CHF (congestive heart failure) (HCC) 08/08/2011   New diagnosis.  . Biliary dyskinesia 03/2010.   Gallbladder ejection fraction 22%.  . Cardiomyopathy 08/12/2011   Ejection fraction 20-25%.  . Diabetes mellitus   . 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   Grade 2    . Hypertension   . MRSA infection 06/02/2011  . Noncompliance   . Tachycardia 06/02/2011   Chronic      History reviewed. No pertinent surgical history.    Social History:     Social History  Substance Use Topics  . Smoking status: Former Smoker    Quit date: 04/18/2011  . Smokeless tobacco: Never Used  . Alcohol use No        Family History :     Family History  Problem Relation Age of Onset  . Diabetes Mother   . Kidney disease Mother   . Coronary artery disease Mother   . Diabetes Father   . Kidney disease Father   . Stroke Father   . Coronary artery disease Father     CABG      Home Medications:   Prior to Admission medications   Medication Sig Start Date End Date Taking? Authorizing Provider  Cinnamon 500 MG capsule Take 500 mg by mouth daily.   Yes Historical Provider, MD  cloNIDine (CATAPRES)  0.3 MG tablet Take 0.3 mg by mouth at bedtime. 10/31/15  Yes Historical Provider, MD  diltiazem (CARDIZEM LA) 180 MG 24 hr tablet Take 180 mg by mouth daily. 10/03/15  Yes Historical Provider, MD  hydrochlorothiazide (HYDRODIURIL) 25 MG tablet Take 25 mg by mouth daily. 10/02/15  Yes Historical Provider, MD  insulin glargine (LANTUS) 100 UNIT/ML injection Inject 0.2 mLs (20 Units total) into the skin at bedtime. 20 units SQ QHS. Insulin PEN if approved, provide syringes and needles if needed. Dx - E11.65 Patient taking differently: Inject 20 Units into the skin 2 (two) times daily.  09/13/14 12/21/18 Yes Leroy SeaPrashant K Singh, MD  furosemide (LASIX) 40 MG tablet Take 1 tablet (40 mg total) by mouth daily. For treatment of your heart. 08/13/11 09/11/15  Elliot Cousinenise Fisher, MD  potassium chloride (K-DUR) 10 MEQ tablet Take 1 tablet (10 mEq total) by mouth 2 (two) times daily. 08/13/11 09/11/15  Elliot Cousinenise Fisher, MD  promethazine (PHENERGAN) 12.5 MG tablet Take 1 tablet (12.5 mg total) by mouth every 6 (six) hours as needed for nausea. 08/13/11 09/11/15  Elliot Cousinenise Fisher, MD  spironolactone  (ALDACTONE) 12.5 mg TABS Take 0.5 tablets (12.5 mg total) by mouth 2 (two) times daily. For your heart. 08/13/11 09/11/15  Elliot Cousinenise Fisher, MD     Allergies:    No Known Allergies   Physical Exam:   Vitals  Blood pressure (!) 193/111, pulse (!) 122, temperature 99.8 F (37.7 C), temperature source Oral, resp. rate 16, height 5\' 8"  (1.727 m), weight 99.8 kg (220 lb), last menstrual period 10/31/2015, SpO2 95 %.   1. General African-American female  lying in bed in NAD, cooperative* with exam  2. Normal affect and insight, Awake Alert, Oriented X 3.  3. No F.N deficits, ALL C.Nerves Intact, Strength 5/5 all 4 extremities, Sensation intact all 4 extremities, Plantars down going.  4. Ears and Eyes appear Normal, Conjunctivae clear, PERRLA. Moist Oral Mucosa.  5. Supple Neck, No JVD, No cervical lymphadenopathy appriciated, No Carotid Bruits.  6. Symmetrical Chest wall movement, Good air movement bilaterally, CTAB.  7. RRR, No Gallops, Rubs or Murmurs, No Parasternal Heave.No Leg edema  8. Positive Bowel Sounds, Abdomen Soft, No tenderness, No organomegaly appriciated,No rebound -guarding or rigidity.  9.  No Cyanosis, Normal Skin Turgor, No Skin Rash or Bruise.  10. Good muscle tone,  joints appear normal , no effusions, Normal ROM.      Data Review:    CBC  Recent Labs Lab 11/03/15 1922  WBC 11.5*  HGB 14.8  HCT 43.4  PLT 474*  MCV 84.4  MCH 28.8  MCHC 34.1  RDW 13.6  LYMPHSABS 1.4  MONOABS 0.9  EOSABS 0.0  BASOSABS 0.0   ------------------------------------------------------------------------------------------------------------------  Chemistries   Recent Labs Lab 11/03/15 1922  NA 140  K 2.8*  CL 102  CO2 23  GLUCOSE 217*  BUN 40*  CREATININE 3.26*  CALCIUM 8.6*  AST 36  ALT 32  ALKPHOS 120  BILITOT 0.9    ------------------------------------------------------------------------------------------------------------------  ------------------------------------------------------------------------------------------------------------------ GFR: Estimated Creatinine Clearance: 26.7 mL/min (by C-G formula based on SCr of 3.26 mg/dL). Liver Function Tests:  Recent Labs Lab 11/03/15 1922  AST 36  ALT 32  ALKPHOS 120  BILITOT 0.9  PROT 7.8  ALBUMIN 3.1*    Recent Labs Lab 11/03/15 1922  LIPASE 42   CBG:  Recent Labs Lab 11/03/15 1843  GLUCAP 217*   Lipid Profile:  --------------------------------------------------------------------------------------------------------------- Urine analysis:    Component Value Date/Time  COLORURINE YELLOW 11/03/2015 2053   APPEARANCEUR CLEAR 11/03/2015 2053   LABSPEC 1.020 11/03/2015 2053   PHURINE 6.0 11/03/2015 2053   GLUCOSEU 250 (A) 11/03/2015 2053   HGBUR LARGE (A) 11/03/2015 2053   BILIRUBINUR NEGATIVE 11/03/2015 2053   KETONESUR TRACE (A) 11/03/2015 2053   PROTEINUR >300 (A) 11/03/2015 2053   UROBILINOGEN 0.2 09/11/2014 0814   NITRITE NEGATIVE 11/03/2015 2053   LEUKOCYTESUR NEGATIVE 11/03/2015 2053      ----------------------------------------------------------------------------------------------------------------   Imaging Results:    No results found.  My personal review of EKG: Rhythm NSR   Assessment & Plan:    Active Problems:   Type II diabetes mellitus with complication, uncontrolled (HCC)   HTN (hypertension)   Abdominal pain   AKI (acute kidney injury) (HCC)   Kidney failure   1. Intractable nausea vomiting- likely from gastroparesis, we'll start Zofran when necessary. Keep patient nothing by mouth, start on IV fluids normal saline at 75 mL/hr 2. Abdominal pain- likely from above, start morphine when necessary for pain 3. Acute kidney injury- patient's creatinine is 3.26, likely from dehydration, her  baseline  creatinine is 1.32 as of June 2016, will start IV fluids and follow BMP in a.m. 4. Hypertensive urgency- will hold by mouth medications, start Catapres 0.3 mg patch  every week, hydralazine 10 mg IV every 4 hours when necessary for BP greater than 160/100. 5. Diabetes mellitus- continue Lantus 20 units, start sliding scale insulin with NovoLog.   DVT Prophylaxis-   Lovenox   AM Labs Ordered, also please review Full Orders  Family Communication: No family at bedside   Code Status:  Full code  Admission status: Observation    Time spent in minutes : 60 minutes   Emmali Karow S M.D on 11/03/2015 at 9:52 PM  Between 7am to 7pm - Pager - [phone]. After 7pm go to [email or web address] - password [facility]  [facility] - Office  [phone]

## 2015-11-03 NOTE — ED Triage Notes (Signed)
Pt comes in for nausea and vomiting starting on Wednesday. Pt has 100.0 temp in triage; pulse 120's; elevated BP. Denies any pain.

## 2015-11-04 DIAGNOSIS — I160 Hypertensive urgency: Secondary | ICD-10-CM

## 2015-11-04 DIAGNOSIS — R112 Nausea with vomiting, unspecified: Secondary | ICD-10-CM

## 2015-11-04 LAB — COMPREHENSIVE METABOLIC PANEL
ALBUMIN: 2.5 g/dL — AB (ref 3.5–5.0)
ALT: 27 U/L (ref 14–54)
ANION GAP: 8 (ref 5–15)
AST: 24 U/L (ref 15–41)
Alkaline Phosphatase: 101 U/L (ref 38–126)
BUN: 32 mg/dL — ABNORMAL HIGH (ref 6–20)
CHLORIDE: 111 mmol/L (ref 101–111)
CO2: 22 mmol/L (ref 22–32)
Calcium: 8.1 mg/dL — ABNORMAL LOW (ref 8.9–10.3)
Creatinine, Ser: 2.63 mg/dL — ABNORMAL HIGH (ref 0.44–1.00)
GFR calc non Af Amer: 21 mL/min — ABNORMAL LOW (ref >60)
GFR, EST AFRICAN AMERICAN: 24 mL/min — AB (ref >60)
Glucose, Bld: 159 mg/dL — ABNORMAL HIGH (ref 65–99)
Potassium: 3.3 mmol/L — ABNORMAL LOW (ref 3.5–5.1)
SODIUM: 141 mmol/L (ref 135–145)
Total Bilirubin: 0.7 mg/dL (ref 0.3–1.2)
Total Protein: 6.4 g/dL — ABNORMAL LOW (ref 6.5–8.1)

## 2015-11-04 LAB — GLUCOSE, CAPILLARY
GLUCOSE-CAPILLARY: 135 mg/dL — AB (ref 65–99)
Glucose-Capillary: 116 mg/dL — ABNORMAL HIGH (ref 65–99)
Glucose-Capillary: 197 mg/dL — ABNORMAL HIGH (ref 65–99)
Glucose-Capillary: 118 mg/dL — ABNORMAL HIGH (ref 65–99)

## 2015-11-04 LAB — MRSA PCR SCREENING: MRSA BY PCR: NEGATIVE

## 2015-11-04 LAB — CBC
HCT: 38.8 % (ref 36.0–46.0)
Hemoglobin: 12.7 g/dL (ref 12.0–15.0)
MCH: 27.8 pg (ref 26.0–34.0)
MCHC: 32.7 g/dL (ref 30.0–36.0)
MCV: 84.9 fL (ref 78.0–100.0)
PLATELETS: 418 10*3/uL — AB (ref 150–400)
RBC: 4.57 MIL/uL (ref 3.87–5.11)
RDW: 13.7 % (ref 11.5–15.5)
WBC: 12 10*3/uL — ABNORMAL HIGH (ref 4.0–10.5)

## 2015-11-04 MED ORDER — CLONIDINE HCL 0.1 MG PO TABS: 0.3000 mg | ORAL_TABLET | Freq: Every day | ORAL | Status: DC

## 2015-11-04 MED ORDER — LABETALOL HCL 5 MG/ML IV SOLN: 5.0000 mg | INTRAVENOUS | Status: DC | PRN

## 2015-11-04 MED ORDER — POTASSIUM CHLORIDE 10 MEQ/100ML IV SOLN
10.0000 meq | INTRAVENOUS | Status: AC
  Administered 2015-11-04 (×4): 10 meq via INTRAVENOUS
  Filled 2015-11-04 (×5): qty 100

## 2015-11-04 MED ORDER — INSULIN ASPART 100 UNIT/ML ~~LOC~~ SOLN
0.0000 [IU] | Freq: Every day | SUBCUTANEOUS | Status: DC
  Administered 2015-11-06: 2 [IU] via SUBCUTANEOUS

## 2015-11-04 MED ORDER — DILTIAZEM HCL ER COATED BEADS 180 MG PO CP24
180.0000 mg | ORAL_CAPSULE | Freq: Every day | ORAL | Status: DC
  Administered 2015-11-04 – 2015-11-05 (×2): 180 mg via ORAL
  Filled 2015-11-04 (×4): qty 1

## 2015-11-04 MED ORDER — ENOXAPARIN SODIUM 40 MG/0.4ML ~~LOC~~ SOLN
40.0000 mg | SUBCUTANEOUS | Status: DC
  Administered 2015-11-05 – 2015-11-06 (×2): 40 mg via SUBCUTANEOUS
  Filled 2015-11-04 (×2): qty 0.4

## 2015-11-04 MED ORDER — CLONIDINE HCL 0.2 MG PO TABS
0.3000 mg | ORAL_TABLET | Freq: Every day | ORAL | Status: DC
  Administered 2015-11-04 – 2015-11-06 (×3): 0.3 mg via ORAL
  Filled 2015-11-04 (×3): qty 1

## 2015-11-04 MED ORDER — INSULIN ASPART 100 UNIT/ML ~~LOC~~ SOLN
0.0000 [IU] | Freq: Three times a day (TID) | SUBCUTANEOUS | Status: DC
  Administered 2015-11-04: 1 [IU] via SUBCUTANEOUS
  Administered 2015-11-04: 2 [IU] via SUBCUTANEOUS
  Administered 2015-11-05 – 2015-11-06 (×4): 1 [IU] via SUBCUTANEOUS
  Administered 2015-11-06: 2 [IU] via SUBCUTANEOUS
  Administered 2015-11-07: 1 [IU] via SUBCUTANEOUS

## 2015-11-04 NOTE — Progress Notes (Signed)
Called report to Johnsie CancelLisa Bullins, RN on dept 300. Verbalized understanding. Pt transferred to room 308 in safe and stable condition.

## 2015-11-04 NOTE — Progress Notes (Signed)
Pt's vital signs are as follows   11/04/15 1006  Vitals  Temp 98.9 F (37.2 C)  Temp Source Oral  BP (!) 195/127 (Rn Notified)  BP Location Left Arm  BP Method Automatic  Patient Position (if appropriate) Lying  Pulse Rate (!) 110  Pulse Rate Source Dinamap  Resp 18  Oxygen Therapy  SpO2 98 %  O2 Device Room Air  Height and Weight  Height 5\' 3"  (1.6 m)  Weight 102.2 kg (225 lb 5 oz)  Type of Scale Used Bed  Type of Weight Actual  BSA (Calculated - sq m) 2.13 sq meters  BMI (Calculated) 40  Weight in (lb) to have BMI = 25 140.8   Hydralazine PRN given for B/P 195/127 and pulse 110.   Will continue to monitor patient throughout the shift.

## 2015-11-04 NOTE — Progress Notes (Signed)
Pt complaining of nausea and vomiting. Received verbal order from MD to administer Zofran 4 mg IV to patient.

## 2015-11-04 NOTE — Progress Notes (Signed)
PROGRESS NOTE    Lauren DoughtyStephanie A Steele  ZOX:[id number] DOB: [birth year] DOA: 11/03/2015 PCP: Karie ChimeraEESE,Lauren D, MD    Brief Narrative:   Lauren ShipperStephanie Steele  is a 45 y.o. female, With history of diabetes mellitus, hypertension, grade 2 diastolic dysfunction who came to the ED with abdominal pain associated nausea and vomiting for past 2 days. Patient says that she was unable  to keep her medications down today and continued to have vomiting. She denies fever or dysuria. She denies chest pain or shortness of breath.  In the ED lab work revealed acute kidney injury, hypokalemia.  Assessment & Plan:   Active Problems:   Type II diabetes mellitus with complication, uncontrolled (HCC)   HTN (hypertension)   Abdominal pain   AKI (acute kidney injury) (HCC)   Kidney failure  1. Intractable nausea vomiting 1. On further questioning, pt reports symptoms started shortly after eating fish taco during return drive following road trip out of state. Patient initially attributed nausea to motion sickness while in vehicle. Symptoms worsened on return home. 2. This AM, patient still complains of continued nausea and abd discomfort, albeit improved since symptom onset. 3. Good bowel sounds on exam. 4. Suspect possible gastroenteritis/food borne illness 5. Continue IVF, supportive care, antiemetics as tolerated 6. Will start clears today. Advance diet as tolerated 2. Abdominal pain 1. likely from above, cont analgesics as needed for pain 3. Acute kidney injury 1. patient's presenting creatinine is 3.26, likely from dehydration, her baseline  creatinine is 1.32 as of June 2016 2. Cont IVF. 3. Follow renal function trends 4. Hypertensive urgency 1. Will resume PO clonidine and PO cardizem 2. Cont to hold diuretics given dehydration 3. Continue IV hydralazine PRN 4. If still poor control, may consider addition of minoxidil. 5. Diabetes mellitus 1. continue Lantus 20 units 2. Continue sliding scale  insulin with NovoLog.  DVT prophylaxis: Lovenox subQ Code Status: Full Family Communication: Pt in room Disposition Plan: Uncertain at this time  Consultants:     Procedures:     Antimicrobials: Anti-infectives    None       Subjective: Still nauseated, but somewhat improved since symptom onset  Objective: Vitals:   11/04/15 0800 11/04/15 0803 11/04/15 1006 11/04/15 1424  BP: 117/89  (!) 195/127 (!) 186/112  Pulse: (!) 107  (!) 110 (!) 116  Resp: (!) 9  18 18   Temp:  97.9 F (36.6 C) 98.9 F (37.2 C) 98.9 F (37.2 C)  TempSrc:  Oral Oral Oral  SpO2: 96%  98% 98%  Weight:   102.2 kg (225 lb 5 oz)   Height:   5\' 3"  (1.6 m)     Intake/Output Summary (Last 24 hours) at 11/04/15 1616 Last data filed at 11/04/15 [id number]  Gross per 24 hour  Intake              100 ml  Output              900 ml  Net             -800 ml   Filed Weights   11/03/15 2304 11/04/15 0500 11/04/15 1006  Weight: 99.7 kg (219 lb 12.8 oz) 99.8 kg (220 lb 0.3 oz) 102.2 kg (225 lb 5 oz)    Examination:  General exam: Appears calm and comfortable  Respiratory system: Clear to auscultation. Respiratory effort normal. Cardiovascular system: S1 & S2 heard, RRR Gastrointestinal system: Abdomen is nondistended, soft and nontender. No organomegaly or masses felt. Normal bowel  sounds heard. Central nervous system: Alert and oriented. No focal neurological deficits. Extremities: Symmetric 5 x 5 power. Skin: No rashes, lesions Psychiatry: Judgement and insight appear normal. Mood & affect appropriate.   Data Reviewed: I have personally reviewed following labs and imaging studies  CBC:  Recent Labs Lab 11/03/15 1922 11/04/15 0508  WBC 11.5* 12.0*  NEUTROABS 9.2*  --   HGB 14.8 12.7  HCT 43.4 38.8  MCV 84.4 84.9  PLT 474* 418*   Basic Metabolic Panel:  Recent Labs Lab 11/03/15 1922 11/04/15 0508  NA 140 141  K 2.8* 3.3*  CL 102 111  CO2 23 22  GLUCOSE 217* 159*  BUN 40* 32*    CREATININE 3.26* 2.63*  CALCIUM 8.6* 8.1*   GFR: Estimated Creatinine Clearance: 30.5 mL/min (by C-G formula based on SCr of 2.63 mg/dL). Liver Function Tests:  Recent Labs Lab 11/03/15 1922 11/04/15 0508  AST 36 24  ALT 32 27  ALKPHOS 120 101  BILITOT 0.9 0.7  PROT 7.8 6.4*  ALBUMIN 3.1* 2.5*    Recent Labs Lab 11/03/15 1922  LIPASE 42   No results for input(s): AMMONIA in the last 168 hours. Coagulation Profile: No results for input(s): INR, PROTIME in the last 168 hours. Cardiac Enzymes: No results for input(s): CKTOTAL, CKMB, CKMBINDEX, TROPONINI in the last 168 hours. BNP (last 3 results) No results for input(s): PROBNP in the last 8760 hours. HbA1C: No results for input(s): HGBA1C in the last 72 hours. CBG:  Recent Labs Lab 11/03/15 1843 11/04/15 0802 11/04/15 1156  GLUCAP 217* 116* 135*   Lipid Profile: No results for input(s): CHOL, HDL, LDLCALC, TRIG, CHOLHDL, LDLDIRECT in the last 72 hours. Thyroid Function Tests: No results for input(s): TSH, T4TOTAL, FREET4, T3FREE, THYROIDAB in the last 72 hours. Anemia Panel: No results for input(s): VITAMINB12, FOLATE, FERRITIN, TIBC, IRON, RETICCTPCT in the last 72 hours. Sepsis Labs: No results for input(s): PROCALCITON, LATICACIDVEN in the last 168 hours.  Recent Results (from the past 240 hour(s))  MRSA PCR Screening     Status: None   Collection Time: 11/03/15 10:50 PM  Result Value Ref Range Status   MRSA by PCR NEGATIVE NEGATIVE Final    Comment:        The GeneXpert MRSA Assay (FDA approved for NASAL specimens only), is one component of a comprehensive MRSA colonization surveillance program. It is not intended to diagnose MRSA infection nor to guide or monitor treatment for MRSA infections.      Radiology Studies: No results found.  Scheduled Meds: . cloNIDine  0.3 mg Oral QHS  . diltiazem  180 mg Oral Daily  . enoxaparin (LOVENOX) injection  40 mg Subcutaneous Q24H  . insulin aspart   0-5 Units Subcutaneous QHS  . insulin aspart  0-9 Units Subcutaneous TID WC  . insulin glargine  20 Units Subcutaneous QHS   Continuous Infusions: . sodium chloride 75 mL/hr at 11/04/15 1230     LOS: 1 day   CHIU, Scheryl MartenSTEPHEN K, MD [facility] Pager [phone]  If 7PM-7AM, please contact night-coverage [email or web address] Password [email or web address] 11/04/2015, 4:16 PM

## 2015-11-05 LAB — GLUCOSE, CAPILLARY
GLUCOSE-CAPILLARY: 133 mg/dL — AB (ref 65–99)
Glucose-Capillary: 149 mg/dL — ABNORMAL HIGH (ref 65–99)
Glucose-Capillary: 144 mg/dL — ABNORMAL HIGH (ref 65–99)
Glucose-Capillary: 157 mg/dL — ABNORMAL HIGH (ref 65–99)

## 2015-11-05 LAB — BASIC METABOLIC PANEL
Anion gap: 5 (ref 5–15)
BUN: 25 mg/dL — AB (ref 6–20)
CALCIUM: 8.1 mg/dL — AB (ref 8.9–10.3)
CO2: 22 mmol/L (ref 22–32)
CREATININE: 2.66 mg/dL — AB (ref 0.44–1.00)
Chloride: 110 mmol/L (ref 101–111)
GFR calc non Af Amer: 20 mL/min — ABNORMAL LOW (ref >60)
GFR, EST AFRICAN AMERICAN: 24 mL/min — AB (ref >60)
Glucose, Bld: 164 mg/dL — ABNORMAL HIGH (ref 65–99)
Potassium: 3.5 mmol/L (ref 3.5–5.1)
SODIUM: 137 mmol/L (ref 135–145)

## 2015-11-05 LAB — HEMOGLOBIN A1C
HEMOGLOBIN A1C: 11.4 % — AB (ref 4.8–5.6)
MEAN PLASMA GLUCOSE: 280 mg/dL

## 2015-11-05 MED ORDER — PROCHLORPERAZINE EDISYLATE 5 MG/ML IJ SOLN
10.0000 mg | INTRAMUSCULAR | Status: DC | PRN
  Administered 2015-11-05 – 2015-11-06 (×2): 10 mg via INTRAVENOUS
  Filled 2015-11-05 (×2): qty 2

## 2015-11-05 MED ORDER — METOPROLOL TARTRATE 25 MG PO TABS
25.0000 mg | ORAL_TABLET | Freq: Two times a day (BID) | ORAL | Status: DC
  Administered 2015-11-05 – 2015-11-07 (×4): 25 mg via ORAL
  Filled 2015-11-05 (×4): qty 1

## 2015-11-05 NOTE — Progress Notes (Signed)
PROGRESS NOTE    Lauren DoughtyStephanie A Denise  ZOX:[id number] DOB: [birth year] DOA: 11/03/2015 PCP: Karie ChimeraEESE,BETTI D, MD    Brief Narrative:   Lauren ShipperStephanie Steele  is a 45 y.o. female, With history of diabetes mellitus, hypertension, grade 2 diastolic dysfunction who came to the ED with abdominal pain associated nausea and vomiting for past 2 days. Patient says that she was unable  to keep her medications down today and continued to have vomiting. She denies fever or dysuria. She denies chest pain or shortness of breath.  In the ED lab work revealed acute kidney injury, hypokalemia.  Assessment & Plan:   Principal Problem:   AKI (acute kidney injury) (HCC) Active Problems:   Malignant hypertension   Type II diabetes mellitus with complication, uncontrolled (HCC)   HTN (hypertension)   Abdominal pain   Kidney failure   Nausea with vomiting  1. Intractable nausea vomiting 1. On further questioning, pt reports symptoms started shortly after eating fish taco during return drive following road trip out of state. Patient initially attributed nausea to motion sickness while in vehicle. Symptoms worsened on return home. 2. Patient reports nausea persists however seem somewhat improved 3. Good bowel sounds on exam. 4. Suspect possible gastroenteritis/food borne illness given history 5. Continue IVF, supportive care, antiemetics as tolerated 6. Advance diet as tolerated 2. Abdominal pain 1. likely from above, cont analgesics as needed for pain 3. Acute kidney injury 1. patient's presenting creatinine is 3.26, likely from dehydration, her baseline  creatinine is 1.32 as of June 2016 2. Cont IVF. 3. Renal function improving, not at baseline yet 4. Hypertensive urgency 1. Resumed PO clonidine and PO cardizem 2. Cont to hold diuretics given dehydration 3. Continue IV hydralazine PRN 4. Will add Toprol 25 mg by mouth twice a day 5. Diabetes mellitus 1. continue Lantus 20 units 2. Continue sliding  scale insulin with NovoLog. 3. Glucose stable at present  DVT prophylaxis: Lovenox subQ Code Status: Full Family Communication: Pt in room Disposition Plan: Uncertain at this time  Consultants:     Procedures:     Antimicrobials: Anti-infectives    None      Subjective: Questioning about going home. Still somewhat nauseated.  Objective: Vitals:   11/05/15 1548 11/05/15 1557 11/05/15 1616 11/05/15 1702  BP: (!) 202/107 (!) 204/102 (!) 184/90 (!) 158/90  Pulse: (!) 105     Resp: 16     Temp: 99 F (37.2 C)     TempSrc: Oral     SpO2: 100%     Weight:      Height:        Intake/Output Summary (Last 24 hours) at 11/05/15 1757 Last data filed at 11/05/15 1500  Gross per 24 hour  Intake              600 ml  Output              600 ml  Net                0 ml   Filed Weights   11/03/15 2304 11/04/15 0500 11/04/15 1006  Weight: 99.7 kg (219 lb 12.8 oz) 99.8 kg (220 lb 0.3 oz) 102.2 kg (225 lb 5 oz)    Examination:  General exam: Appears calm and comfortable, Lying in bed  Respiratory system: Clear to auscultation. Respiratory effort normal. Cardiovascular system: S1 & S2 heard, RRR Gastrointestinal system: Abdomen is nondistended, soft and nontender. No organomegaly or masses felt. Normal bowel sounds heard.  Central nervous system: Alert and oriented. No focal neurological deficits. Extremities: Symmetric 5 x 5 power. Skin: No rashes, lesions Psychiatry: Judgement and insight appear normal. Mood & affect appropriate.   Data Reviewed: I have personally reviewed following labs and imaging studies  CBC:  Recent Labs Lab 11/03/15 1922 11/04/15 0508  WBC 11.5* 12.0*  NEUTROABS 9.2*  --   HGB 14.8 12.7  HCT 43.4 38.8  MCV 84.4 84.9  PLT 474* 418*   Basic Metabolic Panel:  Recent Labs Lab 11/03/15 1922 11/04/15 0508 11/05/15 0558  NA 140 141 137  K 2.8* 3.3* 3.5  CL 102 111 110  CO2 23 22 22   GLUCOSE 217* 159* 164*  BUN 40* 32* 25*    CREATININE 3.26* 2.63* 2.66*  CALCIUM 8.6* 8.1* 8.1*   GFR: Estimated Creatinine Clearance: 30.2 mL/min (by C-G formula based on SCr of 2.66 mg/dL). Liver Function Tests:  Recent Labs Lab 11/03/15 1922 11/04/15 0508  AST 36 24  ALT 32 27  ALKPHOS 120 101  BILITOT 0.9 0.7  PROT 7.8 6.4*  ALBUMIN 3.1* 2.5*    Recent Labs Lab 11/03/15 1922  LIPASE 42   No results for input(s): AMMONIA in the last 168 hours. Coagulation Profile: No results for input(s): INR, PROTIME in the last 168 hours. Cardiac Enzymes: No results for input(s): CKTOTAL, CKMB, CKMBINDEX, TROPONINI in the last 168 hours. BNP (last 3 results) No results for input(s): PROBNP in the last 8760 hours. HbA1C:  Recent Labs  11/03/15 1922  HGBA1C 11.4*   CBG:  Recent Labs Lab 11/04/15 1619 11/04/15 2212 11/05/15 0747 11/05/15 1117 11/05/15 1630  GLUCAP 197* 118* 133* 149* 144*   Lipid Profile: No results for input(s): CHOL, HDL, LDLCALC, TRIG, CHOLHDL, LDLDIRECT in the last 72 hours. Thyroid Function Tests: No results for input(s): TSH, T4TOTAL, FREET4, T3FREE, THYROIDAB in the last 72 hours. Anemia Panel: No results for input(s): VITAMINB12, FOLATE, FERRITIN, TIBC, IRON, RETICCTPCT in the last 72 hours. Sepsis Labs: No results for input(s): PROCALCITON, LATICACIDVEN in the last 168 hours.  Recent Results (from the past 240 hour(s))  MRSA PCR Screening     Status: None   Collection Time: 11/03/15 10:50 PM  Result Value Ref Range Status   MRSA by PCR NEGATIVE NEGATIVE Final    Comment:        The GeneXpert MRSA Assay (FDA approved for NASAL specimens only), is one component of a comprehensive MRSA colonization surveillance program. It is not intended to diagnose MRSA infection nor to guide or monitor treatment for MRSA infections.      Radiology Studies: No results found.  Scheduled Meds: . cloNIDine  0.3 mg Oral QHS  . diltiazem  180 mg Oral Daily  . enoxaparin (LOVENOX)  injection  40 mg Subcutaneous Q24H  . insulin aspart  0-5 Units Subcutaneous QHS  . insulin aspart  0-9 Units Subcutaneous TID WC  . insulin glargine  20 Units Subcutaneous QHS  . metoprolol tartrate  25 mg Oral BID   Continuous Infusions: . sodium chloride 75 mL/hr at 11/05/15 1110     LOS: 2 days   Nickalos Petersen, Scheryl MartenSTEPHEN K, MD [facility] Pager [phone]  If 7PM-7AM, please contact night-coverage [email or web address] Password [email or web address] 11/05/2015, 5:57 PM

## 2015-11-05 NOTE — Progress Notes (Signed)
Inpatient Diabetes Program Recommendations  AACE/ADA: New Consensus Statement on Inpatient Glycemic Control (2015)  Target Ranges:  Prepandial:   less than 140 mg/dL      Peak postprandial:   less than 180 mg/dL (1-2 hours)      Critically ill patients:  140 - 180 mg/dL   Lab Results  Component Value Date   GLUCAP 149 (H) 11/05/2015   HGBA1C 11.4 (H) 11/03/2015   11/05/15 Spoke with pt regarding her elevated A1c.  Pt was interested in OP DM education.  Placed a referral in chart.  Mellissa KohutSherrie Maher Shon RD, CDE. M.Ed. Pager [phone] Inpatient Diabetes Coordinator

## 2015-11-06 LAB — GLUCOSE, CAPILLARY
GLUCOSE-CAPILLARY: 87 mg/dL (ref 65–99)
GLUCOSE-CAPILLARY: 106 mg/dL — AB (ref 65–99)
GLUCOSE-CAPILLARY: 136 mg/dL — AB (ref 65–99)
GLUCOSE-CAPILLARY: 209 mg/dL — AB (ref 65–99)
Glucose-Capillary: 170 mg/dL — ABNORMAL HIGH (ref 65–99)

## 2015-11-06 LAB — BASIC METABOLIC PANEL
Anion gap: 7 (ref 5–15)
BUN: 23 mg/dL — AB (ref 6–20)
CALCIUM: 8.1 mg/dL — AB (ref 8.9–10.3)
CO2: 22 mmol/L (ref 22–32)
CREATININE: 2.59 mg/dL — AB (ref 0.44–1.00)
Chloride: 111 mmol/L (ref 101–111)
GFR calc Af Amer: 24 mL/min — ABNORMAL LOW (ref >60)
GFR, EST NON AFRICAN AMERICAN: 21 mL/min — AB (ref >60)
GLUCOSE: 77 mg/dL (ref 65–99)
POTASSIUM: 3.6 mmol/L (ref 3.5–5.1)
SODIUM: 140 mmol/L (ref 135–145)

## 2015-11-06 MED ORDER — DILTIAZEM HCL ER COATED BEADS 240 MG PO CP24
240.0000 mg | ORAL_CAPSULE | Freq: Every day | ORAL | Status: DC
  Administered 2015-11-06 – 2015-11-07 (×2): 240 mg via ORAL
  Filled 2015-11-06 (×2): qty 1

## 2015-11-06 NOTE — Progress Notes (Addendum)
PROGRESS NOTE    Lauren DoughtyStephanie A Steele  LKG:[id number] DOB: 28-Nov-1970 DOA: 11/03/2015 PCP: Karie ChimeraEESE,BETTI D, MD    Brief Narrative:   Lauren ShipperStephanie Steele  is a 45 y.o. female, With history of diabetes mellitus, hypertension, grade 2 diastolic dysfunction who came to the ED with abdominal pain associated nausea and vomiting for past 2 days. Patient says that she was unable  to keep her medications down today and continued to have vomiting. She denies fever or dysuria. She denies chest pain or shortness of breath.  In the ED lab work revealed acute kidney injury, hypokalemia.  Assessment & Plan:   Principal Problem:   AKI (acute kidney injury) (HCC) Active Problems:   Malignant hypertension   Type II diabetes mellitus with complication, uncontrolled (HCC)   HTN (hypertension)   Abdominal pain   Kidney failure   Nausea with vomiting  1. Intractable nausea vomiting 1. On further questioning, pt reports symptoms started shortly after eating fish taco during return drive following road trip out of state. Patient initially attributed nausea to motion sickness while in vehicle. Symptoms worsened on return home. 2. Patient overall reports feeling much improved, still mildly nauseated at times however. 3. Good bowel sounds on exam. 4. Suspect possible gastroenteritis/food borne illness given history 5. Plan to continue IV fluids and supportive care as needed 6. Continue to advance diet as tolerated 2. Abdominal pain 1. likely from above, cont analgesics as needed for pain 3. Acute kidney injury 1. patient's presenting creatinine is 3.26, likely from dehydration, her baseline  creatinine is 1.32 as of June 2016 2. Cont IVF. 3. Renal function is slowly improving. Not quite at baseline. 4. Repeat renal function in a.m. 4. Hypertensive urgency 1. Resumed PO clonidine and PO cardizem 2. Cont to hold diuretics given dehydration 3. Increase Cardizem to 240 mg by mouth daily. Toprol 25 mg added on  11/05/2015. 4. Continue IV hydralazine PRN 5. Diabetes mellitus 1. continue Lantus 20 units 2. Continue sliding scale insulin with NovoLog. 3. Glucose stable at present  DVT prophylaxis: Lovenox subQ Code Status: Full Family Communication: Pt in room Disposition Plan: Possible discharge home on 11/07/2015 if renal function improves  Consultants:     Procedures:     Antimicrobials: Anti-infectives    None      Subjective: Feels better. Eager to go home  Objective: Vitals:   11/05/15 1702 11/05/15 2115 11/05/15 2138 11/06/15 0651  BP: (!) 158/90 (!) 146/86 (!) 181/93 (!) 177/82  Pulse:  98 93 86  Resp:  20  20  Temp:  98 F (36.7 C)  98.8 F (37.1 C)  TempSrc:  Oral  Oral  SpO2:  97%  96%  Weight:      Height:        Intake/Output Summary (Last 24 hours) at 11/06/15 1528 Last data filed at 11/06/15 1500  Gross per 24 hour  Intake             2760 ml  Output                0 ml  Net             2760 ml   Filed Weights   11/03/15 2304 11/04/15 0500 11/04/15 1006  Weight: 99.7 kg (219 lb 12.8 oz) 99.8 kg (220 lb 0.3 oz) 102.2 kg (225 lb 5 oz)    Examination:  General exam: Appears calm and comfortable, Lying in bed, conversant, pleasant Respiratory system: Clear to auscultation. Respiratory  effort normal. Cardiovascular system: S1 & S2 heard, RRR Gastrointestinal system: Abdomen is nondistended, soft and nontender. No organomegaly or masses felt. Normal bowel sounds heard. Central nervous system: Alert and oriented. No focal neurological deficits. Extremities: Symmetric 5 x 5 power. Skin: No rashes, lesions Psychiatry: Judgement and insight appear normal. Mood & affect appropriate.   Data Reviewed: I have personally reviewed following labs and imaging studies  CBC:  Recent Labs Lab 11/03/15 1922 11/04/15 0508  WBC 11.5* 12.0*  NEUTROABS 9.2*  --   HGB 14.8 12.7  HCT 43.4 38.8  MCV 84.4 84.9  PLT 474* 418*   Basic Metabolic Panel:  Recent  Labs Lab 11/03/15 1922 11/04/15 0508 11/05/15 0558 11/06/15 0613  NA 140 141 137 140  K 2.8* 3.3* 3.5 3.6  CL 102 111 110 111  CO2 23 22 22 22   GLUCOSE 217* 159* 164* 77  BUN 40* 32* 25* 23*  CREATININE 3.26* 2.63* 2.66* 2.59*  CALCIUM 8.6* 8.1* 8.1* 8.1*   GFR: Estimated Creatinine Clearance: 31 mL/min (by C-G formula based on SCr of 2.59 mg/dL). Liver Function Tests:  Recent Labs Lab 11/03/15 1922 11/04/15 0508  AST 36 24  ALT 32 27  ALKPHOS 120 101  BILITOT 0.9 0.7  PROT 7.8 6.4*  ALBUMIN 3.1* 2.5*    Recent Labs Lab 11/03/15 1922  LIPASE 42   No results for input(s): AMMONIA in the last 168 hours. Coagulation Profile: No results for input(s): INR, PROTIME in the last 168 hours. Cardiac Enzymes: No results for input(s): CKTOTAL, CKMB, CKMBINDEX, TROPONINI in the last 168 hours. BNP (last 3 results) No results for input(s): PROBNP in the last 8760 hours. HbA1C:  Recent Labs  11/03/15 1922  HGBA1C 11.4*   CBG:  Recent Labs Lab 11/05/15 1630 11/05/15 2115 11/06/15 0725 11/06/15 0956 11/06/15 1154  GLUCAP 144* 157* 87 106* 170*   Lipid Profile: No results for input(s): CHOL, HDL, LDLCALC, TRIG, CHOLHDL, LDLDIRECT in the last 72 hours. Thyroid Function Tests: No results for input(s): TSH, T4TOTAL, FREET4, T3FREE, THYROIDAB in the last 72 hours. Anemia Panel: No results for input(s): VITAMINB12, FOLATE, FERRITIN, TIBC, IRON, RETICCTPCT in the last 72 hours. Sepsis Labs: No results for input(s): PROCALCITON, LATICACIDVEN in the last 168 hours.  Recent Results (from the past 240 hour(s))  MRSA PCR Screening     Status: None   Collection Time: 11/03/15 10:50 PM  Result Value Ref Range Status   MRSA by PCR NEGATIVE NEGATIVE Final    Comment:        The GeneXpert MRSA Assay (FDA approved for NASAL specimens only), is one component of a comprehensive MRSA colonization surveillance program. It is not intended to diagnose MRSA infection nor to  guide or monitor treatment for MRSA infections.      Radiology Studies: No results found.  Scheduled Meds: . cloNIDine  0.3 mg Oral QHS  . diltiazem  240 mg Oral Daily  . enoxaparin (LOVENOX) injection  40 mg Subcutaneous Q24H  . insulin aspart  0-5 Units Subcutaneous QHS  . insulin aspart  0-9 Units Subcutaneous TID WC  . insulin glargine  20 Units Subcutaneous QHS  . metoprolol tartrate  25 mg Oral BID   Continuous Infusions: . sodium chloride 75 mL/hr at 11/06/15 1249     LOS: 3 days   Aleyza Salmi, Scheryl MartenSTEPHEN K, MD [facility] Pager [phone]  If 7PM-7AM, please contact night-coverage [email or web address] Password [email or web address] 11/06/2015, 3:28 PM

## 2015-11-06 NOTE — Care Management Note (Signed)
Case Management Note  Patient Details  Name: Payton DoughtyStephanie A Leppert MRN: [id number] Date of Birth: 1970-04-25  Subjective/Objective:                  Chart reviewed for CM needs. Pt is from home with family. She is ind with ADL's. She has PCP, drives to appointments and has insurance with drug coverage. She plans to return home with self care.  Action/Plan: Anticipate DC home in next 24 hrs.   Expected Discharge Date:    11/07/2015              Expected Discharge Plan:  Home/Self Care  In-House Referral:  NA  Discharge planning Services  CM Consult  Post Acute Care Choice:  NA Choice offered to:  NA  DME Arranged:    DME Agency:     HH Arranged:    HH Agency:     Status of Service:  Completed, signed off  If discussed at [employer] Length of Stay Meetings, dates discussed:    Additional Comments:  Malcolm MetroChildress, Mairyn Lenahan Demske, RN 11/06/2015, 2:03 PM

## 2015-11-07 DIAGNOSIS — E1165 Type 2 diabetes mellitus with hyperglycemia: Secondary | ICD-10-CM

## 2015-11-07 DIAGNOSIS — I10 Essential (primary) hypertension: Secondary | ICD-10-CM

## 2015-11-07 DIAGNOSIS — G43A0 Cyclical vomiting, not intractable: Secondary | ICD-10-CM

## 2015-11-07 DIAGNOSIS — E118 Type 2 diabetes mellitus with unspecified complications: Secondary | ICD-10-CM

## 2015-11-07 DIAGNOSIS — N179 Acute kidney failure, unspecified: Principal | ICD-10-CM

## 2015-11-07 LAB — BASIC METABOLIC PANEL
ANION GAP: 4 — AB (ref 5–15)
BUN: 22 mg/dL — ABNORMAL HIGH (ref 6–20)
CALCIUM: 8 mg/dL — AB (ref 8.9–10.3)
CO2: 22 mmol/L (ref 22–32)
Chloride: 112 mmol/L — ABNORMAL HIGH (ref 101–111)
Creatinine, Ser: 2.36 mg/dL — ABNORMAL HIGH (ref 0.44–1.00)
GFR, EST AFRICAN AMERICAN: 27 mL/min — AB (ref >60)
GFR, EST NON AFRICAN AMERICAN: 24 mL/min — AB (ref >60)
Glucose, Bld: 66 mg/dL (ref 65–99)
POTASSIUM: 3.5 mmol/L (ref 3.5–5.1)
Sodium: 138 mmol/L (ref 135–145)

## 2015-11-07 LAB — CBC
HEMATOCRIT: 35.7 % — AB (ref 36.0–46.0)
HEMOGLOBIN: 11.8 g/dL — AB (ref 12.0–15.0)
MCH: 28.6 pg (ref 26.0–34.0)
MCHC: 33.1 g/dL (ref 30.0–36.0)
MCV: 86.4 fL (ref 78.0–100.0)
PLATELETS: 376 10*3/uL (ref 150–400)
RBC: 4.13 MIL/uL (ref 3.87–5.11)
RDW: 13.7 % (ref 11.5–15.5)
WBC: 7.8 10*3/uL (ref 4.0–10.5)

## 2015-11-07 LAB — GLUCOSE, CAPILLARY
GLUCOSE-CAPILLARY: 102 mg/dL — AB (ref 65–99)
Glucose-Capillary: 102 mg/dL — ABNORMAL HIGH (ref 65–99)
Glucose-Capillary: 55 mg/dL — ABNORMAL LOW (ref 65–99)
Glucose-Capillary: 143 mg/dL — ABNORMAL HIGH (ref 65–99)

## 2015-11-07 MED ORDER — METOPROLOL TARTRATE 25 MG PO TABS: 25.0000 mg | ORAL_TABLET | Freq: Two times a day (BID) | ORAL | 0 refills | Status: DC

## 2015-11-07 MED ORDER — ONDANSETRON 4 MG PO TBDP: 4.0000 mg | ORAL_TABLET | Freq: Three times a day (TID) | ORAL | 0 refills | Status: DC | PRN

## 2015-11-07 MED ORDER — CLONIDINE HCL 0.2 MG PO TABS
0.3000 mg | ORAL_TABLET | Freq: Once | ORAL | Status: AC
Start: 2015-11-07 — End: 2015-11-07
  Administered 2015-11-07: 0.3 mg via ORAL
  Filled 2015-11-07: qty 1

## 2015-11-07 NOTE — Progress Notes (Signed)
Pt's manual B/P 190/122. MD notified and made aware. Received verbal order for Clonidine 0.3 mg one time dose.

## 2015-11-07 NOTE — Progress Notes (Signed)
Pt's IV catheter removed and intact. Pt's IV site clean dry and intact. Discharge instructions including medications and follow up appointments were reviewed and discussed with patient. Pt verbalized understanding of discharge instructions including medications and follow up appointments. All questions were answered and no further questions at this time. Pt in stable condition and in no acute distress at time of discharge. Pt will be escorted by nurse tech.  

## 2015-11-07 NOTE — Discharge Summary (Signed)
Physician Discharge Summary  Lauren DoughtyStephanie A Steele QIO:[id number] DOB: 1970-09-10 DOA: 11/03/2015  PCP: Lauren ChimeraEESE,Lauren D, MD  Admit date: 11/03/2015 Discharge date: 11/07/2015  Admitted From: home Disposition:  home  Recommendations for Outpatient Follow-up:  1. Follow up with PCP in 1-2 weeks 2. Please obtain BMP/CBC in one week   Home Health: Equipment/Devices:  Discharge Condition: stable CODE STATUS: full Diet recommendation: Heart Healthy / Carb Modified   Brief/Interim Summary: This patient presented to the hospital with intractable nausea and vomiting, abdominal pain. She was suspected to have a gastroenteritis/food borne illness given her history began after eating fish tacos. She reports that her by mouth intake for 2 days prior to admission was poor. She was dizzy on standing. On arrival to the emergency room she was noted to be in acute renal failure and was clinically dehydrated. She was started on IV fluids and supportive treatment for gastroenteritis. Diet was advanced and she is now tolerating a solid diet. She is no longer dizzy. Her renal function has improved, but not back to baseline. Creatinine is currently 2.3. Baseline creatinine is 1.3. She is encouraged to continue with oral hydration. Patient is very insistent on being discharged home today. I've advised her to continue hydration and follow-up with her primary care physician in the next week. We'll continue to hold hydrochlorothiazide. She will need a repeat basic metabolic panel in 1 week. Patient is otherwise stable for discharge.  Discharge Diagnoses:  Principal Problem:   AKI (acute kidney injury) (HCC) Active Problems:   Malignant hypertension   Type II diabetes mellitus with complication, uncontrolled (HCC)   HTN (hypertension)   Abdominal pain   Kidney failure   Nausea with vomiting    Discharge Instructions  Discharge Instructions    Ambulatory referral to Nutrition and Diabetic Education    Complete  by:  As directed   [facility]  [place]   Diet - low sodium heart healthy    Complete by:  As directed   Increase activity slowly    Complete by:  As directed       Medication List    STOP taking these medications   diltiazem 180 MG 24 hr tablet Commonly known as:  CARDIZEM LA   furosemide 40 MG tablet Commonly known as:  LASIX   hydrochlorothiazide 25 MG tablet Commonly known as:  HYDRODIURIL   potassium chloride 10 MEQ tablet Commonly known as:  K-DUR   promethazine 12.5 MG tablet Commonly known as:  PHENERGAN   spironolactone 12.5 mg Tabs tablet Commonly known as:  ALDACTONE     TAKE these medications   Cinnamon 500 MG capsule Take 500 mg by mouth daily.   cloNIDine 0.3 MG tablet Commonly known as:  CATAPRES Take 0.3 mg by mouth at bedtime.   insulin glargine 100 UNIT/ML injection Commonly known as:  LANTUS Inject 0.2 mLs (20 Units total) into the skin at bedtime. 20 units SQ QHS. Insulin PEN if approved, provide syringes and needles if needed. Dx - E11.65 What changed:  when to take this  additional instructions   metoprolol tartrate 25 MG tablet Commonly known as:  LOPRESSOR Take 1 tablet (25 mg total) by mouth 2 (two) times daily.   ondansetron 4 MG disintegrating tablet Commonly known as:  ZOFRAN ODT Take 1 tablet (4 mg total) by mouth every 8 (eight) hours as needed for nausea or vomiting.      Follow-up Information    Lauren D,  MD. Schedule an appointment as soon as possible for a visit in 1 week(s).   Specialty:  Family Medicine Why:  have your kidney function rechecked when you see your primary care doctor Contact information: [address] [place] [address] [phone]          No Known Allergies  Consultations:   Procedures/Studies:  No results found.    Subjective: Feeling better today. Nausea vomiting resolved. No dizziness on admission  Discharge  Exam: Vitals:   11/07/15 1020 11/07/15 1152  BP: (!) 193/102 (!) 157/100  Pulse:  75  Resp:    Temp:     Vitals:   11/06/15 2206 11/07/15 0609 11/07/15 1020 11/07/15 1152  BP: (!) 155/78 (!) 168/73 (!) 193/102 (!) 157/100  Pulse: 84 82  75  Resp: 20 20    Temp: 98 F (36.7 C) 98.3 F (36.8 C)    TempSrc: Oral Oral    SpO2: 100% 100%    Weight:      Height:        General: Pt is alert, awake, not in acute distress Cardiovascular: RRR, S1/S2 +, no rubs, no gallops Respiratory: CTA bilaterally, no wheezing, no rhonchi Abdominal: Soft, NT, ND, bowel sounds + Extremities: no edema, no cyanosis    The results of significant diagnostics from this hospitalization (including imaging, microbiology, ancillary and laboratory) are listed below for reference.     Microbiology: Recent Results (from the past 240 hour(s))  MRSA PCR Screening     Status: None   Collection Time: 11/03/15 10:50 PM  Result Value Ref Range Status   MRSA by PCR NEGATIVE NEGATIVE Final    Comment:        The GeneXpert MRSA Assay (FDA approved for NASAL specimens only), is one component of a comprehensive MRSA colonization surveillance program. It is not intended to diagnose MRSA infection nor to guide or monitor treatment for MRSA infections.      Labs: BNP (last 3 results) No results for input(s): BNP in the last 8760 hours. Basic Metabolic Panel:  Recent Labs Lab 11/03/15 1922 11/04/15 0508 11/05/15 0558 11/06/15 0613 11/07/15 0544  NA 140 141 137 140 138  K 2.8* 3.3* 3.5 3.6 3.5  CL 102 111 110 111 112*  CO2 23 22 22 22 22   GLUCOSE 217* 159* 164* 77 66  BUN 40* 32* 25* 23* 22*  CREATININE 3.26* 2.63* 2.66* 2.59* 2.36*  CALCIUM 8.6* 8.1* 8.1* 8.1* 8.0*   Liver Function Tests:  Recent Labs Lab 11/03/15 1922 11/04/15 0508  AST 36 24  ALT 32 27  ALKPHOS 120 101  BILITOT 0.9 0.7  PROT 7.8 6.4*  ALBUMIN 3.1* 2.5*    Recent Labs Lab 11/03/15 1922  LIPASE 42   No results  for input(s): AMMONIA in the last 168 hours. CBC:  Recent Labs Lab 11/03/15 1922 11/04/15 0508 11/07/15 0544  WBC 11.5* 12.0* 7.8  NEUTROABS 9.2*  --   --   HGB 14.8 12.7 11.8*  HCT 43.4 38.8 35.7*  MCV 84.4 84.9 86.4  PLT 474* 418* 376   Cardiac Enzymes: No results for input(s): CKTOTAL, CKMB, CKMBINDEX, TROPONINI in the last 168 hours. BNP: Invalid input(s): POCBNP CBG:  Recent Labs Lab 11/06/15 1617 11/06/15 2019 11/07/15 0732 11/07/15 0810 11/07/15 1114  GLUCAP 136* 209* 55* 102* 143*   Steele-Dimer No results for input(s): DDIMER in the last 72 hours. Hgb A1c No results for input(s): HGBA1C in the last 72 hours.  Lipid Profile No results for input(s): CHOL, HDL, LDLCALC, TRIG, CHOLHDL, LDLDIRECT in the last 72 hours. Thyroid function studies No results for input(s): TSH, T4TOTAL, T3FREE, THYROIDAB in the last 72 hours.  Invalid input(s): FREET3 Anemia work up No results for input(s): VITAMINB12, FOLATE, FERRITIN, TIBC, IRON, RETICCTPCT in the last 72 hours. Urinalysis    Component Value Date/Time   COLORURINE YELLOW 11/03/2015 2053   APPEARANCEUR CLEAR 11/03/2015 2053   LABSPEC 1.020 11/03/2015 2053   PHURINE 6.0 11/03/2015 2053   GLUCOSEU 250 (A) 11/03/2015 2053   HGBUR LARGE (A) 11/03/2015 2053   BILIRUBINUR NEGATIVE 11/03/2015 2053   KETONESUR TRACE (A) 11/03/2015 2053   PROTEINUR >300 (A) 11/03/2015 2053   UROBILINOGEN 0.2 09/11/2014 0814   NITRITE NEGATIVE 11/03/2015 2053   LEUKOCYTESUR NEGATIVE 11/03/2015 2053   Sepsis Labs Invalid input(s): PROCALCITONIN,  WBC,  LACTICIDVEN Microbiology Recent Results (from the past 240 hour(s))  MRSA PCR Screening     Status: None   Collection Time: 11/03/15 10:50 PM  Result Value Ref Range Status   MRSA by PCR NEGATIVE NEGATIVE Final    Comment:        The GeneXpert MRSA Assay (FDA approved for NASAL specimens only), is one component of a comprehensive MRSA colonization surveillance program. It is  not intended to diagnose MRSA infection nor to guide or monitor treatment for MRSA infections.      Time coordinating discharge: Over 30 minutes  SIGNED:   Erick BlinksMEMON,Lindsea Olivar, MD  [facility] 11/07/2015, 2:38 PM Pager [phone]  If 7PM-7AM, please contact night-coverage [email or web address] Password [email or web address]

## 2015-11-07 NOTE — Progress Notes (Signed)
Pt CBG 102.

## 2015-11-07 NOTE — Progress Notes (Signed)
Pt's CBG 55. Pt asymptomatic and denies any discomfort. Pt offered 4 ozs orange juice. Will recheck pt's CBG and continue to monitor patient throughout the shift.

## 2015-12-06 ENCOUNTER — Emergency Department (HOSPITAL_COMMUNITY)
Admission: EM | Admit: 2015-12-06 | Discharge: 2015-12-07 | Discharge status: Against Medical Advice | Payer: BLUE CROSS/BLUE SHIELD | Attending: Emergency Medicine | Admitting: Emergency Medicine

## 2015-12-06 ENCOUNTER — Encounter (HOSPITAL_COMMUNITY): Payer: Self-pay | Admitting: *Deleted

## 2015-12-06 DIAGNOSIS — Z87891 Personal history of nicotine dependence: Secondary | ICD-10-CM | POA: Insufficient documentation

## 2015-12-06 DIAGNOSIS — R531 Weakness: Secondary | ICD-10-CM | POA: Diagnosis present

## 2015-12-06 DIAGNOSIS — I5021 Acute systolic (congestive) heart failure: Secondary | ICD-10-CM | POA: Insufficient documentation

## 2015-12-06 DIAGNOSIS — E860 Dehydration: Secondary | ICD-10-CM | POA: Diagnosis not present

## 2015-12-06 DIAGNOSIS — N179 Acute kidney failure, unspecified: Secondary | ICD-10-CM

## 2015-12-06 DIAGNOSIS — E119 Type 2 diabetes mellitus without complications: Secondary | ICD-10-CM | POA: Diagnosis not present

## 2015-12-06 DIAGNOSIS — I110 Hypertensive heart disease with heart failure: Secondary | ICD-10-CM | POA: Diagnosis not present

## 2015-12-06 DIAGNOSIS — Z79899 Other long term (current) drug therapy: Secondary | ICD-10-CM | POA: Diagnosis not present

## 2015-12-06 LAB — BASIC METABOLIC PANEL
Anion gap: 13 (ref 5–15)
BUN: 38 mg/dL — AB (ref 6–20)
CHLORIDE: 93 mmol/L — AB (ref 101–111)
CO2: 25 mmol/L (ref 22–32)
CREATININE: 3.61 mg/dL — AB (ref 0.44–1.00)
Calcium: 8.1 mg/dL — ABNORMAL LOW (ref 8.9–10.3)
GFR calc Af Amer: 16 mL/min — ABNORMAL LOW (ref >60)
GFR calc non Af Amer: 14 mL/min — ABNORMAL LOW (ref >60)
GLUCOSE: 347 mg/dL — AB (ref 65–99)
Potassium: 3.4 mmol/L — ABNORMAL LOW (ref 3.5–5.1)
Sodium: 131 mmol/L — ABNORMAL LOW (ref 135–145)

## 2015-12-06 LAB — CBC WITH DIFFERENTIAL/PLATELET
Basophils Absolute: 0 10*3/uL (ref 0.0–0.1)
Basophils Relative: 0 %
Eosinophils Absolute: 0 10*3/uL (ref 0.0–0.7)
Eosinophils Relative: 0 %
HEMATOCRIT: 41.1 % (ref 36.0–46.0)
HEMOGLOBIN: 14.1 g/dL (ref 12.0–15.0)
LYMPHS ABS: 2 10*3/uL (ref 0.7–4.0)
Lymphocytes Relative: 15 %
MCH: 28.7 pg (ref 26.0–34.0)
MCHC: 34.3 g/dL (ref 30.0–36.0)
MCV: 83.5 fL (ref 78.0–100.0)
MONO ABS: 1 10*3/uL (ref 0.1–1.0)
MONOS PCT: 8 %
NEUTROS ABS: 10.3 10*3/uL — AB (ref 1.7–7.7)
NEUTROS PCT: 77 %
Platelets: 491 10*3/uL — ABNORMAL HIGH (ref 150–400)
RBC: 4.92 MIL/uL (ref 3.87–5.11)
RDW: 13.1 % (ref 11.5–15.5)
WBC: 13.4 10*3/uL — ABNORMAL HIGH (ref 4.0–10.5)

## 2015-12-06 MED ORDER — SODIUM CHLORIDE 0.9 % IV SOLN
Freq: Once | INTRAVENOUS | Status: AC
  Administered 2015-12-06: 23:00:00 via INTRAVENOUS

## 2015-12-06 MED ORDER — SODIUM CHLORIDE 0.9 % IV BOLUS (SEPSIS)
1000.0000 mL | Freq: Once | INTRAVENOUS | Status: AC
  Administered 2015-12-07: 1000 mL via INTRAVENOUS

## 2015-12-06 MED ORDER — CLONIDINE HCL 0.2 MG PO TABS
0.3000 mg | ORAL_TABLET | Freq: Once | ORAL | Status: AC
  Administered 2015-12-06: 0.3 mg via ORAL
  Filled 2015-12-06: qty 1

## 2015-12-06 NOTE — ED Notes (Signed)
Pt states that she has not had her evening dose of her blood pressure medication,

## 2015-12-06 NOTE — ED Triage Notes (Signed)
Pt states that she had the "stomach bug":n/v, abd pain the first part of the week, reports that she is no  longer having any problems with any of those symptoms but continues to experience generalized weakness,  states " I am just so weak"

## 2015-12-06 NOTE — ED Provider Notes (Signed)
Emergency Department Provider Note  By signing my name below, I, Lauren Steele, attest that this documentation has been prepared under the direction and in the presence of Lauren Steele . Electronically Signed: Nelwyn Steele, Scribe. 12/06/2015. 10:35 PM.  I have reviewed the triage vital signs and the nursing notes.   HISTORY  Chief Complaint Weakness  HPI Comments:  Lauren Steele is a 45 y.o. female with PMHx of DM and HTN who presents to the Emergency Department complaining of sudden-onset constant unchanged weakness beginning 2-3 days ago. Pt reports that she was suffering from a "stomach bug" earlier this week and was vomiting. Her GI symptoms have resolved yesterday, but the pt's weakness has not changed. She endorses associated nausea, but states that this has been resolved by taking Zofran at home. She denies any diarrhea or abdominal pain.   Past Medical History:  Diagnosis Date  . Abnormal thyroid function test 08/13/2011   Slightly low TSH, slightly low free T3, and slightly elevated free T4.  Marland Kitchen Acute systolic CHF (congestive heart failure) (HCC) 08/08/2011   New diagnosis.  . Biliary dyskinesia 03/2010.   Gallbladder ejection fraction 22%.  . Cardiomyopathy 08/12/2011   Ejection fraction 20-25%.  . Diabetes mellitus   . 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 Hypertension   . MRSA infection 06/02/2011  . Noncompliance   . Tachycardia 06/02/2011   Chronic    Patient Active Problem List   Diagnosis Date Noted  . Nausea with vomiting 11/04/2015  . Kidney failure 11/03/2015  . DKA (diabetic ketoacidoses) (HCC) 09/11/2014  . Seizure (HCC) 09/11/2014  . AKI (acute kidney injury) (HCC) 09/11/2014  . Agitation 09/11/2014  . Hyperosmolar non-ketotic state in patient with type 2 diabetes mellitus (HCC) 09/11/2014  . Acute encephalopathy 09/11/2014  . NSVT (nonsustained ventricular tachycardia) (HCC)   . Cardiomyopathy (HCC) 08/13/2011  . Abnormal  thyroid function test 08/13/2011  . Ovarian cyst 08/10/2011  . Biliary dyskinesia 08/09/2011  . Acute systolic CHF (congestive heart failure) (HCC) 08/08/2011  . Type II diabetes mellitus with complication, uncontrolled (HCC) 08/08/2011  . HTN (hypertension) 08/08/2011  . UTI (lower urinary tract infection) 08/08/2011  . Abdominal pain 08/08/2011  . Malignant hypertension 05/31/2011  . Hypokalemia 05/29/2011    History reviewed. No pertinent surgical history.  Current Outpatient Rx  . Order #: [id number] Class: Historical Med  . Order #: [id number] Class: Historical Med  . Order #: [id number] Class: Print  . Order #: [id number] Class: Print  . Order #: [id number] Class: Print    Allergies Review of patient's allergies indicates no known allergies.  Family History  Problem Relation Age of Onset  . Diabetes Mother   . Kidney disease Mother   . Coronary artery disease Mother   . Diabetes Father   . Kidney disease Father   . Stroke Father   . Coronary artery disease Father     CABG    Social History Social History  Substance Use Topics  . Smoking status: Former Smoker    Quit date: 04/18/2011  . Smokeless tobacco: Never Used  . Alcohol use No    Review of Systems  Constitutional: No fever/chills. Generalized weakness.  Eyes: No visual changes. ENT: No sore throat. Cardiovascular: Denies chest pain. Respiratory: Denies shortness of breath. Gastrointestinal: No abdominal pain.  No nausea, no vomiting.  No diarrhea.  No constipation. Genitourinary: Negative for dysuria. Musculoskeletal: Negative for back pain. Skin: Negative for rash. Neurological: Negative for headaches, focal weakness or numbness.  10-point ROS otherwise negative.  ____________________________________________   PHYSICAL EXAM:  VITAL SIGNS: ED Triage Vitals  Enc Vitals Group     BP 12/06/15 2153 (!) 206/130     Pulse Rate 12/06/15 2153 (!) 123     Resp 12/06/15 2153 20     Temp 12/06/15 2153 99.1  F (37.3 C)     Temp src --      SpO2 12/06/15 2153 96 %     Weight 12/06/15 2156 215 lb (97.5 kg)     Height 12/06/15 2156 5\' 8"  (1.727 m)   Constitutional: Alert and oriented. Well appearing and in no acute distress. Eyes: Conjunctivae are normal.  Head: Atraumatic. Nose: No congestion/rhinnorhea. Mouth/Throat: Mucous membranes are dry. Oropharynx non-erythematous. Neck: No stridor.  Cardiovascular: Tachycardia. Good peripheral circulation. Grossly normal heart sounds.   Respiratory: Normal respiratory effort.  No retractions. Lungs CTAB. Gastrointestinal: Soft and nontender. No distention.  Musculoskeletal: No lower extremity tenderness nor edema. No gross deformities of extremities. Neurologic:  Normal speech and language. No gross focal neurologic deficits are appreciated.  Skin:  Skin is warm, dry and intact. No rash noted.  ____________________________________________   LABS (all labs ordered are listed, but only abnormal results are displayed)  DIAGNOSTIC STUDIES:  Oxygen Saturation is 96% on RA, normal by my interpretation.    COORDINATION OF CARE:  10:43 PM Discussed treatment Steele with pt at bedside which included bloodwork and pt agreed to Steele.   Labs Reviewed  CBC WITH DIFFERENTIAL/PLATELET - Abnormal; Notable for the following:       Result Value   WBC 13.4 (*)    Platelets 491 (*)    Neutro Abs 10.3 (*)    All other components within normal limits  BASIC METABOLIC PANEL - Abnormal; Notable for the following:    Sodium 131 (*)    Potassium 3.4 (*)    Chloride 93 (*)    Glucose, Bld 347 (*)    BUN 38 (*)    Creatinine, Ser 3.61 (*)    Calcium 8.1 (*)    GFR calc non Af Amer 14 (*)    GFR calc Af Amer 16 (*)    All other components within normal limits   ____________________________________________  EKG   EKG Interpretation  Date/Time:  Thursday December 06 2015 23:04:42 EDT Ventricular Rate:  116 PR Interval:    QRS Duration: 85 QT  Interval:  329 QTC Calculation: 457 R Axis:   37 Text Interpretation:  Sinus tachycardia Biatrial enlargement No STEMI.  Similar to prior tracings.  Confirmed by Lauren Treptow Steele, Lauren Steele [phone]) on 12/06/2015 11:10:30 PM      ____________________________________________   PROCEDURES  Procedure(s) performed:   Procedures  None ____________________________________________   INITIAL IMPRESSION / ASSESSMENT AND Steele / ED COURSE  Pertinent labs & imaging results that were available during my care of the patient were reviewed by me and considered in my medical decision making (see chart for details).  Patient presents to the ED for evaluation of generalized weakness in the setting of recent GI illness and AKI. Low suspicion for ACS presentation but will follow EKG and CXR.   11:34 PM Patient with elevated creatinine from discharge. She is tachycardic and hypertensive. Patient missed her nighttime BP medications. WIll give this medication dose. Creatinine elevated. Advised IVF hydration and creatinine trending in the hospital but the patient refused. I am concerned that the patient could be moving towards renal failure. She has decided to sign out AMA. Discussed  the risks with this. The patient understands and would like to be discharged to follow up with her PCP.   ____________________________________________  FINAL CLINICAL IMPRESSION(S) / ED DIAGNOSES  Final diagnoses:  AKI (acute kidney injury) (HCC)  Dehydration     MEDICATIONS GIVEN DURING THIS VISIT:  Medications  0.9 %  sodium chloride infusion ( Intravenous Stopped 12/07/15 0000)  cloNIDine (CATAPRES) tablet 0.3 mg (0.3 mg Oral Given 12/06/15 2352)  sodium chloride 0.9 % bolus 1,000 mL (0 mLs Intravenous Stopped 12/07/15 0126)     NEW OUTPATIENT MEDICATIONS STARTED DURING THIS VISIT:  None   Note:  This document was prepared using Dragon voice recognition software and may include unintentional dictation errors.  Alona BeneJoshua  Nazyia Gaugh, Steele Emergency [facility]  Performed with the assistance of a scribe. I have reviewed all documentation and made changes as needed.     Lauren PlanJoshua G Natlie Asfour, Steele 12/07/15 1316

## 2015-12-06 NOTE — Discharge Instructions (Signed)
You were seen in the ED today with fatigue and were shown to have worsening kidney function and signs of tachycardia. We recommended admission but you decided to sign out of the Emergency Department AGAINST MEDICAL ADVICE.   We want you to known that you can return to the ED at any time but especially if you feel worse. Call your primary doctor in the morning to schedule the soonest possible appointment.

## 2016-08-13 ENCOUNTER — Encounter: Payer: Self-pay | Admitting: Nurse Practitioner

## 2016-08-19 ENCOUNTER — Ambulatory Visit (INDEPENDENT_AMBULATORY_CARE_PROVIDER_SITE_OTHER): Payer: BLUE CROSS/BLUE SHIELD | Admitting: Nurse Practitioner

## 2016-08-19 ENCOUNTER — Encounter (INDEPENDENT_AMBULATORY_CARE_PROVIDER_SITE_OTHER): Payer: Self-pay

## 2016-08-19 ENCOUNTER — Encounter: Payer: Self-pay | Admitting: Nurse Practitioner

## 2016-08-19 VITALS — BP 130/84 | HR 93 | Ht 66.5 in | Wt 226.0 lb

## 2016-08-19 DIAGNOSIS — R112 Nausea with vomiting, unspecified: Secondary | ICD-10-CM

## 2016-08-19 MED ORDER — METOCLOPRAMIDE HCL 10 MG PO TABS: 10.0000 mg | ORAL_TABLET | Freq: Two times a day (BID) | ORAL | 0 refills | Status: AC | PRN

## 2016-08-19 NOTE — Progress Notes (Addendum)
HPI:  Patient is a 46 year old female with multiple medical problems referred by PCP, Dr. Pecola Leisureeese, for evaluation of abdominal pain, nausea and vomiting.  Unfortunately I do not have any records from PCP at the time of this visit. Patient has DM 2, hypertension, obesity, hx of seizures, and cardiomyopathy.   Lauren Steele has chronic intermittent nausea and vomiting, she is even been hospitalized for it her symptoms are episodic and in between time she feels okay. She has diabetes, uncontrolled at this point with a recent hemoglobin A1c of 12 (per patient). When episodes of nausea vomiting, patient tells me her blood sugars are usually in the 120 range however. A couple weeks ago patient had another episode of nausea, vomiting. She was unable to hold anything down . She also had some mid abdominal pain and loose stool but neither were nearly as bothersome as the nausea / vomiting. No fevers. No new medications, no contact with others with similar symptoms. She saw PCP last Wednesday, labs were taken but I do not have results. Patient was also started on triple therapy for H. Pylori. Interestingly her symptoms immediately began to improve and have totally resolved on antibiotics . It should be noted that her insulin was also recently increased so better blood sugar control might be contributing to her improvement . Patient tells me that several months ago she was treated with the same exact antibiotics and PPI with resolution of her symptoms then as well.     Past Medical History:  Diagnosis Date  . Abnormal thyroid function test 08/13/2011   Slightly low TSH, slightly low free T3, and slightly elevated free T4.  Marland Kitchen. Acute systolic CHF (congestive heart failure) (HCC) 08/08/2011   New diagnosis.  . Biliary dyskinesia 03/2010.   Gallbladder ejection fraction 22%.  . Cardiomyopathy 08/12/2011   Ejection fraction 20-25%.  . Diabetes mellitus   . 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  Hypertension   . MRSA infection 06/02/2011  . Noncompliance   . Tachycardia 06/02/2011   Chronic     History reviewed. No pertinent surgical history. Family History  Problem Relation Age of Onset  . Diabetes Mother   . Kidney disease Mother   . Coronary artery disease Mother   . Diabetes Father   . Kidney disease Father   . Stroke Father   . Coronary artery disease Father        CABG   Social History  Substance Use Topics  . Smoking status: Former Smoker    Quit date: 04/18/2011  . Smokeless tobacco: Never Used  . Alcohol use No   Current Outpatient Prescriptions  Medication Sig Dispense Refill  . AMBULATORY NON FORMULARY MEDICATION Medication Name: lansoprazole delayed release 30 mg, amoxicillin 500 mg and clarithromycin 500 mg take 4 tablets by mouth twice daily 14 days    . cloNIDine (CATAPRES) 0.3 MG tablet Take 0.3 mg by mouth at bedtime.    . insulin detemir (LEVEMIR) 100 UNIT/ML injection Inject 40 Units into the skin at bedtime.    Marland Kitchen. omeprazole (PRILOSEC) 20 MG capsule Take 20 mg by mouth daily.     No current facility-administered medications for this visit.    No Known Allergies   Review of Systems: All systems reviewed and negative except where noted in HPI.    Physical Exam: BP 130/84 (BP Location: Left Arm, Patient Position: Sitting, Cuff Size: Large)   Pulse 93   Ht 5' 6.5" (1.689 m)  Wt 226 lb (102.5 kg)   BMI 35.93 kg/m  Constitutional:  Obese black female in no acute distress. Psychiatric: Normal mood and affect. Behavior is normal. EENT: Pupils normal.  Conjunctivae are normal. No scleral icterus. Neck supple.  Cardiovascular: Normal rate, regular rhythm. No edema Pulmonary/chest: Effort normal and breath sounds normal. No wheezing, rales or rhonchi. Abdominal: Soft, nondistended. Nontender. Bowel sounds active throughout. There are no masses palpable.No  Lymphadenopathy: No cervical adenopathy noted. Neurological: Alert and oriented to person  place and time. Skin: Skin is warm and dry. No rashes noted.   ASSESSMENT AND PLAN: 58. 46 year old female with recent recurrent episode of severe nausea, vomiting, mid abdominal discomfort. Symptoms have improved on triple therapy for H. Pylori. I do not have any PCP records but I assume patient recently tested positive for H. Pylori. Interestingly, she took the same exact antibiotics several months ago. - I am awaiting records for clarification. She will complete the antibiotics. If H. pylori positive then we will obtain stool antigen in a month or so to confirm eradication. She will need to be off PPI for 14 days prior to the test -Her symptoms have totally resolved after starting antibiotics She reports a recent hemoglobin A1c of 12. 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 She may be feeling better because insulin dose was recently increased.   -Recommend patient see a diabetes educator to help with uncontrolled diabetes -I'm going to give her Reglan 10 mg BID as needed to take at the onset of next nausea / vomiting episode. I explained potential extrapyramidal effects of Reglan and she will stop it should any of these things occur.   -Consider EGD if symptoms persists despite better glucose control -I doubt biliary source of nausea and vomiting though will keep in mind since she does have cholelithiasis.  -Awaiting labs from PCP. I will call her if I need any additional labs.   2. Cardiomyopathy-Last echo in Epic from 2013 reveals grade 2 diastolic dysfunction  / EF of 69-62%.  3. DM2, on insulin  4. CKD, (stage 4?  ) based on labs in 2017   5. Hx of seizures  6. HTN, stable. BP 130 / 84 today.  Catapress dose recently increase.    Willette Cluster, NP  08/19/2016, 10:58 AM  Cc:  [name], MD   ADDENDUM 09/08/16 Received labs from PCP drawn 08/13/16. CBC was normal. Hemoglobin 14, white count 7.6. Her TSH was normal. ALT 128, transaminases  and total bilirubin normal.

## 2016-08-19 NOTE — Patient Instructions (Signed)
If you are age 48 or older, your body mass index should be between 23-30. Your Body mass index is 35.93 kg/m. If this is out of the aforementioned range listed, please consider follow up with your Primary Care Provider.  If you are age 764 or younger, your body mass index should be between 19-25. Your Body mass index is 35.93 kg/m. If this is out of the aformentioned range listed, please consider follow up with your Primary Care Provider.   We have sent the following medications to your pharmacy for you to pick up at your convenience: Reglan 10 mg  Waiting on records and labs from Primary Care Physician.  Thank you for choosing me and [facility].   Willette ClusterPaula Guenther, NP

## 2016-08-20 NOTE — Progress Notes (Signed)
Agree with assessment and plan. If she tested positive for H pylori and received therapy, agree with eradication testing with stool AG as outlined. Otherwise agree that gastroparesis is also high on differential and can give a trial of low dose Reglan to use as needed. If symptoms persist would obtain GES and recommend endoscopy as well.

## 2016-09-18 ENCOUNTER — Telehealth: Payer: Self-pay | Admitting: Gastroenterology

## 2016-09-18 ENCOUNTER — Ambulatory Visit: Payer: BLUE CROSS/BLUE SHIELD | Admitting: Gastroenterology

## 2016-10-31 ENCOUNTER — Encounter: Payer: Self-pay | Admitting: Gastroenterology

## 2016-10-31 ENCOUNTER — Other Ambulatory Visit (INDEPENDENT_AMBULATORY_CARE_PROVIDER_SITE_OTHER): Payer: BLUE CROSS/BLUE SHIELD

## 2016-10-31 ENCOUNTER — Ambulatory Visit (INDEPENDENT_AMBULATORY_CARE_PROVIDER_SITE_OTHER): Payer: BLUE CROSS/BLUE SHIELD | Admitting: Gastroenterology

## 2016-10-31 VITALS — BP 160/118 | HR 100 | Ht 66.25 in | Wt 214.0 lb

## 2016-10-31 DIAGNOSIS — Z8679 Personal history of other diseases of the circulatory system: Secondary | ICD-10-CM

## 2016-10-31 DIAGNOSIS — Z1211 Encounter for screening for malignant neoplasm of colon: Secondary | ICD-10-CM

## 2016-10-31 DIAGNOSIS — R112 Nausea with vomiting, unspecified: Secondary | ICD-10-CM

## 2016-10-31 DIAGNOSIS — R7401 Elevation of levels of liver transaminase levels: Secondary | ICD-10-CM

## 2016-10-31 DIAGNOSIS — Z8619 Personal history of other infectious and parasitic diseases: Secondary | ICD-10-CM

## 2016-10-31 DIAGNOSIS — R740 Nonspecific elevation of levels of transaminase and lactic acid dehydrogenase [LDH]: Secondary | ICD-10-CM

## 2016-10-31 LAB — HEPATIC FUNCTION PANEL
ALBUMIN: 2.8 g/dL — AB (ref 3.5–5.2)
ALT: 9 U/L (ref 0–35)
AST: 9 U/L (ref 0–37)
Alkaline Phosphatase: 162 U/L — ABNORMAL HIGH (ref 39–117)
Bilirubin, Direct: 0.1 mg/dL (ref 0.0–0.3)
Total Bilirubin: 0.4 mg/dL (ref 0.2–1.2)
Total Protein: 6.1 g/dL (ref 6.0–8.3)

## 2016-10-31 MED ORDER — METOCLOPRAMIDE HCL 5 MG PO TABS: 5.0000 mg | ORAL_TABLET | Freq: Three times a day (TID) | ORAL | 3 refills | Status: AC

## 2016-10-31 NOTE — Progress Notes (Signed)
HPI :  46 year old female here for follow-up visit. First time I have met her, she was previously seen by Tye Savoy in June.   She is a history of chronic intermittent nausea and vomiting. She had reportedly tested positive for H pylori and treated with antibiotics. She has a history of diabetes and hemoglobin A1c has been over 12. Since the last visit she had tried using Reglan PRN for severe symptoms, which she is not sure how much it has helped, although not taking it much. She thinks her diabetes has been better controlled recently, only one episode of severe nausea since her visit and she thinks better control of DM is making her feel better in general. She thinks certain foods can precipitate her symptoms. She does have some early satiety, trying to eat smaller portions. She has not had much vomiting. No abdominal pains after she eats.   She denies any history of abnormality of her liver enzymes. No FH of liver diseases. No alcohol use. She thinks her weight can flucuate with her diabetes. She is on levemir and sliding scale Novologue. She's had LFTs done recently in June showing an ALT of 128. [place] in 2013 show GB sludge.   She denies any cardiopulmonary symptoms, no shortness of breath or chest pains. She had an echocardiogram done in 2013 showing an EF of 38% with diastolic dysfunction. She has not follow up with cardiology since that time.   No prior colonoscopy, no blood in the stools. No bowel troubles otherwise. No FH of colon cancer.   Past Medical History:  Diagnosis Date  . Abnormal thyroid function test 08/13/2011   Slightly low TSH, slightly low free T3, and slightly elevated free T4.  Marland Kitchen Acute systolic CHF (congestive heart failure) ([place]) 08/08/2011   New diagnosis.  . Biliary dyskinesia 03/2010.   Gallbladder ejection fraction 22%.  . Cardiomyopathy 08/12/2011   Ejection fraction 20-25%.  . Diabetes mellitus   . Diastolic dysfunction 7/56/4332.   Grade 2  . Hypertension    . MRSA infection 06/02/2011  . Noncompliance   . Tachycardia 06/02/2011   Chronic     Past Surgical History:  Procedure Laterality Date  . NO PAST SURGERIES     Family History  Problem Relation Age of Onset  . Diabetes Mother   . Kidney disease Mother   . Coronary artery disease Mother   . Diabetes Father   . Kidney disease Father   . Stroke Father   . Coronary artery disease Father        CABG  . Colon cancer Neg Hx   . Rectal cancer Neg Hx   . Throat cancer Neg Hx    Social History  Substance Use Topics  . Smoking status: Former Smoker    Years: 15.00    Types: Cigarettes    Quit date: 04/18/2011  . Smokeless tobacco: Never Used  . Alcohol use No   Current Outpatient Prescriptions  Medication Sig Dispense Refill  . AMBULATORY NON FORMULARY MEDICATION Medication Name: lansoprazole delayed release 30 mg, amoxicillin 500 mg and clarithromycin 500 mg take 4 tablets by mouth twice daily 14 days    . cloNIDine (CATAPRES) 0.3 MG tablet Take 0.3 mg by mouth at bedtime.    . insulin detemir (LEVEMIR) 100 UNIT/ML injection Inject 40 Units into the skin at bedtime.    . metoCLOPramide (REGLAN) 10 MG tablet Take 1 tablet (10 mg total) by mouth every 12 (twelve) hours as needed for nausea.  20 tablet 0  . omeprazole (PRILOSEC) 20 MG capsule Take 20 mg by mouth daily.    . metoCLOPramide (REGLAN) 5 MG tablet Take 1 tablet (5 mg total) by mouth 3 (three) times daily before meals. 84 tablet 3   No current facility-administered medications for this visit.    No Known Allergies   Review of Systems: All systems reviewed and negative except where noted in HPI.    No results found.  Physical Exam: BP (!) 160/118   Pulse 100   Ht 5' 6.25" (1.683 m) Comment: without shoes  Wt 214 lb (97.1 kg)   BMI 34.28 kg/m   Constitutional: Pleasant,well-developed, female in no acute distress. HEENT: Normocephalic and atraumatic. Conjunctivae are normal. No scleral icterus. Neck supple.    Cardiovascular: Normal rate, regular rhythm.  Pulmonary/chest: Effort normal and breath sounds normal. No wheezing, rales or rhonchi. Abdominal: Soft, nondistended, nontender. There are no masses palpable. No hepatomegaly. Extremities: no edema Lymphadenopathy: No cervical adenopathy noted. Neurological: Alert and oriented to person place and time. Skin: Skin is warm and dry. No rashes noted. Psychiatric: Normal mood and affect. Behavior is normal.   ASSESSMENT AND PLAN: 46 year old female with history as outlined above, here for reassessment following issues:  Nausea / vomiting / history of H pylori - Overall improved since her last visit. I suspect she may likely have gastroparesis in the setting of poorly controlled diabetes. She's not been taking Reglan much, only as needed, seems to feel improved with better control of her diabetes which she is working on. I discussed Reglan with her potential side effects, she felt more comfortable taking a lower dose such as 5 mg 3 times a day to reduce risk of side effects. She will try this. I do think she warrants H. pylori eradication testing - we discussed options for this. I recommend an upper endoscopy however she needs a more recent echocardiogram and cardiology clearance prior to giving her anesthesia. We'll hold off on this for now, hold omeprazole for 2 weeks, and then submit a H. pylori stool specimen to test for eradication.   Elevated ALT - noted on labs from June, will repeat LFTs to see if this is persistent and check hepatitis B / C serologies. If ALT is persistent she will need further workup with [place] to assess for steatosis and additional lab workup.   Colon cancer screening - she is overdue for screening, we discussed colonoscopy versus stool based testing. She prefers colonoscopy after a discussion of this issue, but she needs cardiac reassessment prior to proceeding with this.   History of CHF - I offered her a repeat echocardiogram,  she prefers to follow up with her primary care and establish with cardiology first. Given her history of low EF of 20%, she needs a reassessment of this issue prior to proceeding with endoscopic evaluation. She reports she will do this on her own, declined referred at this time.   History of DM / HTN - she will follow up with her primary care for this issue, not taking Catapress due to symptoms, she other therapy for HTN.   Patient in agreement with plan and verbalized understanding, all questions answered.  [place] Cellar, MD [facility] Pager [phone]

## 2016-10-31 NOTE — Patient Instructions (Signed)
If you are age 48 or older, your body mass index should be between 23-30. Your Body mass index is 34.28 kg/m. If this is out of the aforementioned range listed, please consider follow up with your Primary Care Provider.  If you are age 49 or younger, your body mass index should be between 19-25. Your Body mass index is 34.28 kg/m. If this is out of the aformentioned range listed, please consider follow up with your Primary Care Provider.   Your physician has requested that you go to the basement for lab work before leaving today.  We have sent the following medications to your pharmacy for you to pick up at your convenience:  Reglan  Before submitting stool study please hold your Omeprazole for 2 weeks.  Please follow up with your PCP for diabetes and blood pressure  You must have a echocardiogram prior to scheduling a colonoscopy and endoscopy. Please follow up with your cardiologist.  Thank you.

## 2016-11-01 LAB — HEPATITIS B SURFACE ANTIGEN: HEP B S AG: NONREACTIVE

## 2016-11-01 LAB — HEPATITIS C ANTIBODY: HCV AB: NONREACTIVE

## 2016-11-06 ENCOUNTER — Other Ambulatory Visit: Payer: Self-pay

## 2016-11-06 DIAGNOSIS — R945 Abnormal results of liver function studies: Principal | ICD-10-CM

## 2016-11-06 DIAGNOSIS — R7989 Other specified abnormal findings of blood chemistry: Secondary | ICD-10-CM

## 2017-03-29 ENCOUNTER — Encounter (HOSPITAL_COMMUNITY): Payer: Self-pay

## 2017-03-29 ENCOUNTER — Emergency Department (HOSPITAL_COMMUNITY)
Admission: EM | Admit: 2017-03-29 | Discharge: 2017-03-29 | Disposition: A | Discharge status: Other Acute Inpatient Hospital | Payer: BLUE CROSS/BLUE SHIELD | Attending: Emergency Medicine | Admitting: Emergency Medicine

## 2017-03-29 ENCOUNTER — Emergency Department (HOSPITAL_COMMUNITY): Payer: BLUE CROSS/BLUE SHIELD

## 2017-03-29 DIAGNOSIS — I110 Hypertensive heart disease with heart failure: Secondary | ICD-10-CM | POA: Insufficient documentation

## 2017-03-29 DIAGNOSIS — I5021 Acute systolic (congestive) heart failure: Secondary | ICD-10-CM | POA: Diagnosis not present

## 2017-03-29 DIAGNOSIS — R4182 Altered mental status, unspecified: Secondary | ICD-10-CM

## 2017-03-29 DIAGNOSIS — E1110 Type 2 diabetes mellitus with ketoacidosis without coma: Secondary | ICD-10-CM | POA: Insufficient documentation

## 2017-03-29 DIAGNOSIS — I619 Nontraumatic intracerebral hemorrhage, unspecified: Secondary | ICD-10-CM | POA: Insufficient documentation

## 2017-03-29 DIAGNOSIS — Z794 Long term (current) use of insulin: Secondary | ICD-10-CM | POA: Insufficient documentation

## 2017-03-29 DIAGNOSIS — Z87891 Personal history of nicotine dependence: Secondary | ICD-10-CM | POA: Insufficient documentation

## 2017-03-29 DIAGNOSIS — N179 Acute kidney failure, unspecified: Secondary | ICD-10-CM | POA: Diagnosis not present

## 2017-03-29 DIAGNOSIS — I10 Essential (primary) hypertension: Secondary | ICD-10-CM

## 2017-03-29 DIAGNOSIS — Z79899 Other long term (current) drug therapy: Secondary | ICD-10-CM | POA: Insufficient documentation

## 2017-03-29 DIAGNOSIS — I161 Hypertensive emergency: Secondary | ICD-10-CM | POA: Insufficient documentation

## 2017-03-29 HISTORY — DX: Nausea with vomiting, unspecified: R11.2

## 2017-03-29 LAB — RAPID URINE DRUG SCREEN, HOSP PERFORMED
Amphetamines: NOT DETECTED
BARBITURATES: NOT DETECTED
BENZODIAZEPINES: NOT DETECTED
COCAINE: NOT DETECTED
OPIATES: NOT DETECTED
TETRAHYDROCANNABINOL: NOT DETECTED

## 2017-03-29 LAB — CBC WITH DIFFERENTIAL/PLATELET
Basophils Absolute: 0 10*3/uL (ref 0.0–0.1)
Basophils Relative: 1 %
EOS PCT: 0 %
Eosinophils Absolute: 0 10*3/uL (ref 0.0–0.7)
HEMATOCRIT: 28.1 % — AB (ref 36.0–46.0)
Hemoglobin: 8.9 g/dL — ABNORMAL LOW (ref 12.0–15.0)
LYMPHS ABS: 0.8 10*3/uL (ref 0.7–4.0)
LYMPHS PCT: 11 %
MCH: 27.1 pg (ref 26.0–34.0)
MCHC: 31.7 g/dL (ref 30.0–36.0)
MCV: 85.7 fL (ref 78.0–100.0)
MONO ABS: 0.3 10*3/uL (ref 0.1–1.0)
MONOS PCT: 4 %
Neutro Abs: 5.9 10*3/uL (ref 1.7–7.7)
Neutrophils Relative %: 84 %
PLATELETS: 330 10*3/uL (ref 150–400)
RBC: 3.28 MIL/uL — ABNORMAL LOW (ref 3.87–5.11)
RDW: 14.6 % (ref 11.5–15.5)
WBC: 7.1 10*3/uL (ref 4.0–10.5)

## 2017-03-29 LAB — BLOOD GAS, ARTERIAL
ACID-BASE DEFICIT: 13.5 mmol/L — AB (ref 0.0–2.0)
BICARBONATE: 13.4 mmol/L — AB (ref 20.0–28.0)
DRAWN BY: 22179
FIO2: 1
O2 SAT: 93.1 %
PCO2 ART: 43.8 mmHg (ref 32.0–48.0)
PEEP: 5 cmH2O
PH ART: 7.13 — AB (ref 7.350–7.450)
Patient temperature: 37
RATE: 16 resp/min
VT: 500 mL
pO2, Arterial: 94.6 mmHg (ref 83.0–108.0)

## 2017-03-29 LAB — COMPREHENSIVE METABOLIC PANEL
ALK PHOS: 123 U/L (ref 38–126)
ALT: 33 U/L (ref 14–54)
AST: 21 U/L (ref 15–41)
Albumin: 3.4 g/dL — ABNORMAL LOW (ref 3.5–5.0)
Anion gap: 19 — ABNORMAL HIGH (ref 5–15)
BILIRUBIN TOTAL: 1 mg/dL (ref 0.3–1.2)
BUN: 75 mg/dL — AB (ref 6–20)
CALCIUM: 8.5 mg/dL — AB (ref 8.9–10.3)
CO2: 15 mmol/L — ABNORMAL LOW (ref 22–32)
CREATININE: 9.81 mg/dL — AB (ref 0.44–1.00)
Chloride: 110 mmol/L (ref 101–111)
GFR calc Af Amer: 5 mL/min — ABNORMAL LOW (ref >60)
GFR, EST NON AFRICAN AMERICAN: 4 mL/min — AB (ref >60)
Glucose, Bld: 208 mg/dL — ABNORMAL HIGH (ref 65–99)
POTASSIUM: 4.6 mmol/L (ref 3.5–5.1)
Sodium: 144 mmol/L (ref 135–145)
Total Protein: 7.2 g/dL (ref 6.5–8.1)

## 2017-03-29 LAB — I-STAT CHEM 8, ED
BUN: 83 mg/dL — AB (ref 6–20)
CHLORIDE: 113 mmol/L — AB (ref 101–111)
Calcium, Ion: 0.96 mmol/L — ABNORMAL LOW (ref 1.15–1.40)
Creatinine, Ser: 11.1 mg/dL — ABNORMAL HIGH (ref 0.44–1.00)
Glucose, Bld: 182 mg/dL — ABNORMAL HIGH (ref 65–99)
HCT: 25 % — ABNORMAL LOW (ref 36.0–46.0)
Hemoglobin: 8.5 g/dL — ABNORMAL LOW (ref 12.0–15.0)
Potassium: 5.7 mmol/L — ABNORMAL HIGH (ref 3.5–5.1)
SODIUM: 140 mmol/L (ref 135–145)
TCO2: 16 mmol/L — ABNORMAL LOW (ref 22–32)

## 2017-03-29 LAB — URINALYSIS, ROUTINE W REFLEX MICROSCOPIC
Bilirubin Urine: NEGATIVE
Glucose, UA: 150 mg/dL — AB
KETONES UR: 5 mg/dL — AB
Nitrite: NEGATIVE
PH: 5 (ref 5.0–8.0)
Protein, ur: >=300 mg/dL — AB
SPECIFIC GRAVITY, URINE: 1.013 (ref 1.005–1.030)

## 2017-03-29 LAB — ACETAMINOPHEN LEVEL: Acetaminophen (Tylenol), Serum: <10 ug/mL — ABNORMAL LOW (ref 10–30)

## 2017-03-29 LAB — PREGNANCY, URINE: Preg Test, Ur: NEGATIVE

## 2017-03-29 LAB — TROPONIN I: Troponin I: 0.05 ng/mL (ref <0.03)

## 2017-03-29 LAB — ETHANOL

## 2017-03-29 LAB — LACTIC ACID, PLASMA: Lactic Acid, Venous: 1.9 mmol/L (ref 0.5–1.9)

## 2017-03-29 LAB — SALICYLATE LEVEL: Salicylate Lvl: <7 mg/dL (ref 2.8–30.0)

## 2017-03-29 LAB — BRAIN NATRIURETIC PEPTIDE: B Natriuretic Peptide: 1639 pg/mL — ABNORMAL HIGH (ref 0.0–100.0)

## 2017-03-29 MED ORDER — SODIUM CHLORIDE 0.9 % IV BOLUS (SEPSIS)
500.0000 mL | Freq: Once | INTRAVENOUS | Status: AC
  Administered 2017-03-29: 500 mL via INTRAVENOUS

## 2017-03-29 MED ORDER — ROCURONIUM BROMIDE 50 MG/5ML IV SOLN
1.0000 mg/kg | Freq: Once | INTRAVENOUS | Status: AC
  Administered 2017-03-29: 60 mg via INTRAVENOUS

## 2017-03-29 MED ORDER — ETOMIDATE 2 MG/ML IV SOLN
0.6000 mg/kg | Freq: Once | INTRAVENOUS | Status: AC
  Administered 2017-03-29: 20 mg via INTRAVENOUS

## 2017-03-29 MED ORDER — NICARDIPINE HCL IN NACL 20-0.86 MG/200ML-% IV SOLN
0.0000 mg/h | INTRAVENOUS | Status: DC
  Administered 2017-03-29: 5 mg/h via INTRAVENOUS
  Filled 2017-03-29 (×2): qty 200

## 2017-03-29 MED ORDER — PROPOFOL 1000 MG/100ML IV EMUL
INTRAVENOUS | Status: AC
  Administered 2017-03-29: 17:00:00
  Filled 2017-03-29: qty 100

## 2017-03-29 MED ORDER — SODIUM CHLORIDE 0.9 % IV SOLN
INTRAVENOUS | Status: DC
  Administered 2017-03-29: 17:00:00 via INTRAVENOUS

## 2017-03-29 MED ORDER — PROPOFOL 1000 MG/100ML IV EMUL
INTRAVENOUS | Status: AC
  Administered 2017-03-29: 20:00:00
  Filled 2017-03-29: qty 100

## 2017-03-29 NOTE — Progress Notes (Signed)
Retracted et tube to 21, Patient is overbreathing vent rate 30-23 vent set at 14 , Saturationl 100 percent ,

## 2017-03-29 NOTE — ED Notes (Signed)
Can't complete all of Triage due to patient's condition

## 2017-03-29 NOTE — ED Notes (Signed)
Date and time results received: 03/29/17 1749  Test: ABG Critical Value: pH 7.13, PCO2 43.8, PO2 94.6, Bicarb 13.4, S02 93.1  Name of Provider Notified: Dr. Clarene DukeMcManus  Orders Received? Or Actions Taken?: No new orders given at this time.

## 2017-03-29 NOTE — ED Notes (Signed)
Notes made by Jerald KiefEboni Wiley RN from 1640 to 1750 where made by Wynona Dovehris Nikkia Devoss RN by accident

## 2017-03-29 NOTE — ED Notes (Signed)
Date and time results received: 03/29/17 1824 (use smartphrase ".now" to insert current time)  Test: trop Critical Value: 0.05   Name of Provider Notified: [email or web address]  Orders Received? Or Actions Taken?: none

## 2017-03-29 NOTE — ED Notes (Signed)
 60mg  Rocuronium in at

## 2017-03-29 NOTE — ED Notes (Signed)
RSI kit pulled for patient

## 2017-03-29 NOTE — ED Triage Notes (Signed)
Pt brought in for drug overdose. Prescription of Metoclopram, Olmesartan, and Clonidine given to EMS by man at patients home, but her name is not on the Clonidine. Pt was given 2 of Narcan and an ammonia inhalant. Pt started throwing up after given this by EMS.

## 2017-03-29 NOTE — ED Provider Notes (Signed)
[facility] EMERGENCY DEPARTMENT Provider Note   CSN: [id number] Arrival date & time: 03/29/17  1617     History   Chief Complaint Chief Complaint  Patient presents with  . Drug Overdose    HPI Lauren Steele is a 47 y.o. female.  The history is provided by the EMS personnel. The history is limited by the condition of the patient (unresponsive).  Drug Overdose   Pt was seen at 1620. Per EMS and family report to EMS:  Pt's husband states pt "was fine, walking around." He took a shower and when he came out, pt was lying on the bed, unresponsive with snoring respirations. EMS arrived on scene and found pt this way. EMS gave ammonia inhalant and IV narcan 2mg  with resultant N/V, but no change in mental status. EMS was given meds bottles by husband, that he stated were the pt's meds: reglan, olmesartan, clonidine. Clonidine bottle does not have pt's name on it. Pt arrived to ED unresponsive with snoring resps, mouth being suctioned free of blood tinged secretions.   Past Medical History:  Diagnosis Date  . Abnormal thyroid function test 08/13/2011   Slightly low TSH, slightly low free T3, and slightly elevated free T4.  Marland Kitchen Acute systolic CHF (congestive heart failure) (HCC) 08/08/2011   New diagnosis.  . Biliary dyskinesia 03/2010.   Gallbladder ejection fraction 22%.  . Cardiomyopathy 08/12/2011   Ejection fraction 20-25%.  . Diabetes mellitus   . 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 Hypertension   . MRSA infection 06/02/2011  . Nausea and vomiting    chronic  . Noncompliance   . Tachycardia 06/02/2011   Chronic    Patient Active Problem List   Diagnosis Date Noted  . Nausea with vomiting 11/04/2015  . Kidney failure 11/03/2015  . DKA (diabetic ketoacidoses) (HCC) 09/11/2014  . Seizure (HCC) 09/11/2014  . AKI (acute kidney injury) (HCC) 09/11/2014  . Agitation 09/11/2014  . Hyperosmolar non-ketotic state in patient with type 2 diabetes mellitus (HCC)  09/11/2014  . Acute encephalopathy 09/11/2014  . NSVT (nonsustained ventricular tachycardia) (HCC)   . Cardiomyopathy (HCC) 08/13/2011  . Abnormal thyroid function test 08/13/2011  . Ovarian cyst 08/10/2011  . Biliary dyskinesia 08/09/2011  . Acute systolic CHF (congestive heart failure) (HCC) 08/08/2011  . Type II diabetes mellitus with complication, uncontrolled (HCC) 08/08/2011  . HTN (hypertension) 08/08/2011  . UTI (lower urinary tract infection) 08/08/2011  . Abdominal pain 08/08/2011  . Malignant hypertension 05/31/2011  . Hypokalemia 05/29/2011    Past Surgical History:  Procedure Laterality Date  . NO PAST SURGERIES      OB History    No data available       Home Medications    Prior to Admission medications   Medication Sig Start Date End Date Taking? Authorizing Provider  AMBULATORY NON FORMULARY MEDICATION Medication Name: lansoprazole delayed release 30 mg, amoxicillin 500 mg and clarithromycin 500 mg take 4 tablets by mouth twice daily 14 days    [provider]  cloNIDine (CATAPRES) 0.3 MG tablet Take 0.3 mg by mouth at bedtime. 10/31/15   [provider]  insulin detemir (LEVEMIR) 100 UNIT/ML injection Inject 40 Units into the skin at bedtime.    [provider]  metoCLOPramide (REGLAN) 10 MG tablet Take 1 tablet (10 mg total) by mouth every 12 (twelve) hours as needed for nausea. 08/19/16   Meredith Pel, NP  metoCLOPramide (REGLAN) 5 MG tablet Take 1 tablet (5 mg  total) by mouth 3 (three) times daily before meals. 10/31/16   Armbruster, Willaim Rayas, MD  omeprazole (PRILOSEC) 20 MG capsule Take 20 mg by mouth daily.    [provider]    Family History Family History  Problem Relation Age of Onset  . Diabetes Mother   . Kidney disease Mother   . Coronary artery disease Mother   . Diabetes Father   . Kidney disease Father   . Stroke Father   . Coronary artery disease Father        CABG  . Colon cancer Neg Hx   .  Rectal cancer Neg Hx   . Throat cancer Neg Hx     Social History Social History   Tobacco Use  . Smoking status: Former Smoker    Years: 15.00    Types: Cigarettes    Last attempt to quit: 04/18/2011    Years since quitting: 5.9  . Smokeless tobacco: Never Used  Substance Use Topics  . Alcohol use: No  . Drug use: No     Allergies   Patient has no known allergies.   Review of Systems Review of Systems  Unable to perform ROS: Patient unresponsive    Physical Exam Updated Vital Signs BP (!) 225/125   Pulse (!) 133   Resp (!) 21   SpO2 100%    Patient Vitals for the past 24 hrs:  BP Temp Pulse Resp SpO2  03/29/17 1845 (!) 164/84 99.3 F (37.4 C) (!) 128 (!) 29 100 %  03/29/17 1830 (!) 188/86 99.1 F (37.3 C) (!) 110 (!) 39 91 %  03/29/17 1815 - 99.1 F (37.3 C) - - -  03/29/17 1813 - - - - 100 %  03/29/17 1800 (!) 201/109 - (!) 127 (!) 23 100 %  03/29/17 1730 (!) 225/125 - (!) 133 (!) 21 100 %  03/29/17 1700 (!) 211/113 - (!) 127 (!) 0 100 %  03/29/17 1655 - - - - 100 %  03/29/17 1641 (!) 223/127 - - 20 -  03/29/17 1630 (!) 200/159 - (!) 124 (!) 25 96 %  03/29/17 1626 (!) 226/214 - (!) 128 (!) 25 (!) 88 %     Physical Exam 1620: Physical examination:  Nursing notes reviewed; Vital signs and O2 SAT reviewed;  Constitutional: Well developed, Well nourished, Well hydrated, In no acute distress; Head:  Normocephalic, atraumatic; Eyes: EOMI, PERRL, No scleral icterus; ENMT: Mouth and pharynx normal, Mucous membranes moist; Neck: Supple, Full range of motion, No lymphadenopathy; Cardiovascular: Tachycardic and rhythm, No gallop; Respiratory: Breath sounds coarse & equal bilaterally, No wheezes. Snoring respirations. Normal respiratory effort/excursion; Chest: No deformity, Movement normal; Abdomen: Soft, Nontender, Nondistended, Normal bowel sounds;; Extremities: Pulses normal, No deformity, No edema, No calf edema or asymmetry.; Neuro: Unresponsive, will move extremities  spontaneously and to painful stimuli. No facial droop.  Does not follow commands..; Skin: Color normal, Warm, Dry.    ED Treatments / Results  Labs (all labs ordered are listed, but only abnormal results are displayed)   EKG  EKG Interpretation  Date/Time:  Sunday March 29 2017 16:26:09 EST Ventricular Rate:  127 PR Interval:    QRS Duration: 100 QT Interval:  325 QTC Calculation: 473 R Axis:   78 Text Interpretation:  Sinus tachycardia LAE, consider biatrial enlargement Nonspecific ST and T wave abnormality Baseline wander Artifact When compared with ECG of 12/06/2015 baseline wander and  Artifact is now Present Confirmed by Samuel Jester [phone]) on 03/29/2017 7:02:52  PM       Radiology   Procedures Procedures (including critical care time)  Airway procedure:  Timeout: Pre-procedure timeout not performed due to emergent nature of procedure; Indication: Unresponsive;  Oxygen Saturation: 100 %; Oxygen concentration: 100 %; Preoxygenation: Bag-valve-mask;  Medication: Etomidate, Rocuronium; Procedure: Suctioning, RSI, Glidescope laryngoscopy, Endotracheal intubation with 7.42mm cuffed endotracheal tube, Bag-valve-tube ventilation, Mechanical ventilation;  Reassessment: Successful intubation, No active bleeding or obvious trauma in oral cavity or posterior pharynx. Teeth intact, without obvious trauma. Breath sounds equal bilaterally, No breath sounds heard over stomach, Chest movement symmetrical, CO2 detector color change, Endotracheal tube fogging, Oxygen saturation normal. Post-procedure xray obtained.      Medications Ordered in ED Medications  propofol (DIPRIVAN) 1000 MG/100ML infusion (not administered)  0.9 %  sodium chloride infusion (not administered)  sodium chloride 0.9 % bolus 500 mL (not administered)  etomidate (AMIDATE) injection 0.6 mg/kg (20 mg Intravenous Given 03/29/17 1637)  rocuronium (ZEMURON) injection 1 mg/kg (60 mg Intravenous Given 03/29/17  1638)     Initial Impression / Assessment and Plan / ED Course  I have reviewed the triage vital signs and the nursing notes.  Pertinent labs & imaging results that were available during my care of the patient were reviewed by me and considered in my medical decision making (see chart for details).  MDM Reviewed: previous chart, nursing note and vitals Reviewed previous: labs and ECG Interpretation: labs, ECG, x-ray and CT scan Total time providing critical care: 30-74 minutes. This excludes time spent performing separately reportable procedures and services. Consults: critical care   CRITICAL CARE Performed by: Laray Anger Total critical care time: 50 minutes Critical care time was exclusive of separately billable procedures and treating other patients. Critical care was necessary to treat or prevent imminent or life-threatening deterioration. Critical care was time spent personally by me on the following activities: development of treatment plan with patient and/or surrogate as well as nursing, discussions with consultants, evaluation of patient's response to treatment, examination of patient, obtaining history from patient or surrogate, ordering and performing treatments and interventions, ordering and review of laboratory studies, ordering and review of radiographic studies, pulse oximetry and re-evaluation of patient's condition.   Results for orders placed or performed during the hospital encounter of 03/29/17  CBC with Differential  Result Value Ref Range   WBC 7.1 4.0 - 10.5 K/uL   RBC 3.28 (L) 3.87 - 5.11 MIL/uL   Hemoglobin 8.9 (L) 12.0 - 15.0 g/dL   HCT 45.4 (L) 09.8 - 11.9 %   MCV 85.7 78.0 - 100.0 fL   MCH 27.1 26.0 - 34.0 pg   MCHC 31.7 30.0 - 36.0 g/dL   RDW 14.7 82.9 - 56.2 %   Platelets 330 150 - 400 K/uL   Neutrophils Relative % 84 %   Neutro Abs 5.9 1.7 - 7.7 K/uL   Lymphocytes Relative 11 %   Lymphs Abs 0.8 0.7 - 4.0 K/uL   Monocytes Relative 4 %    Monocytes Absolute 0.3 0.1 - 1.0 K/uL   Eosinophils Relative 0 %   Eosinophils Absolute 0.0 0.0 - 0.7 K/uL   Basophils Relative 1 %   Basophils Absolute 0.0 0.0 - 0.1 K/uL  Urinalysis, Routine w reflex microscopic  Result Value Ref Range   Color, Urine YELLOW YELLOW   APPearance HAZY (A) CLEAR   Specific Gravity, Urine 1.013 1.005 - 1.030   pH 5.0 5.0 - 8.0   Glucose, UA 150 (A) NEGATIVE mg/dL   Hgb  urine dipstick SMALL (A) NEGATIVE   Bilirubin Urine NEGATIVE NEGATIVE   Ketones, ur 5 (A) NEGATIVE mg/dL   Protein, ur >=952>=300 (A) NEGATIVE mg/dL   Nitrite NEGATIVE NEGATIVE   Leukocytes, UA TRACE (A) NEGATIVE   RBC / HPF TOO NUMEROUS TO COUNT 0 - 5 RBC/hpf   WBC, UA TOO NUMEROUS TO COUNT 0 - 5 WBC/hpf   Bacteria, UA FEW (A) NONE SEEN   Squamous Epithelial / LPF 6-30 (A) NONE SEEN  Urine rapid drug screen (hosp performed)  Result Value Ref Range   Opiates NONE DETECTED NONE DETECTED   Cocaine NONE DETECTED NONE DETECTED   Benzodiazepines NONE DETECTED NONE DETECTED   Amphetamines NONE DETECTED NONE DETECTED   Tetrahydrocannabinol NONE DETECTED NONE DETECTED   Barbiturates NONE DETECTED NONE DETECTED  Blood gas, arterial  Result Value Ref Range   FIO2 1.00    Delivery systems VENTILATOR    VT 500 mL   LHR 16 resp/min   Peep/cpap 5.0 cm H20   pH, Arterial 7.130 (LL) 7.350 - 7.450   pCO2 arterial 43.8 32.0 - 48.0 mmHg   pO2, Arterial 94.6 83.0 - 108.0 mmHg   Bicarbonate 13.4 (L) 20.0 - 28.0 mmol/L   Acid-base deficit 13.5 (H) 0.0 - 2.0 mmol/L   O2 Saturation 93.1 %   Patient temperature 37.0    Collection site LEFT RADIAL    Drawn by [id number]    Sample type ARTERIAL    Allens test (pass/fail) PASS PASS  I-stat chem 8, ed  Result Value Ref Range   Sodium 140 135 - 145 mmol/L   Potassium 5.7 (H) 3.5 - 5.1 mmol/L   Chloride 113 (H) 101 - 111 mmol/L   BUN 83 (H) 6 - 20 mg/dL   Creatinine, Ser 84.1311.10 (H) 0.44 - 1.00 mg/dL   Glucose, Bld 244182 (H) 65 - 99 mg/dL   Calcium, Ion 0.100.96  (L) 1.15 - 1.40 mmol/L   TCO2 16 (L) 22 - 32 mmol/L   Hemoglobin 8.5 (L) 12.0 - 15.0 g/dL   HCT 27.225.0 (L) 53.636.0 - 64.446.0 %    Ct Head Wo Contrast Result Date: 03/29/2017 CLINICAL DATA:  Drug overdose.  Altered mental status.  Vomiting. EXAM: CT HEAD WITHOUT CONTRAST TECHNIQUE: Contiguous axial images were obtained from the base of the skull through the vertex without intravenous contrast. COMPARISON:  09/11/2014. FINDINGS: Brain: Interval large acute hemorrhage centered in the midbrain and left thalamic region. This measures 4.1 x 3.3 cm on image number 16 of series 2. On image number 17 of that series, this is causing 1.1 cm of midline shift to the right. There is also mild flattening of the quadrigeminal plate cistern on the right. Hemorrhage is also demonstrated in both lateral ventricles, 3rd ventricle and 4th ventricle. There has been an interval increase in size of the right lateral ventricle. Low density is demonstrated adjacent to the hemorrhage in the brainstem and left cerebral hemisphere. Interval areas of and external capsule. Low density in the right basal ganglia. There is also periventricular white matter low density in both frontal lobes, with progression. Vascular: No hyperdense vessel or unexpected calcification. Skull: Normal. Negative for fracture or focal lesion. Sinuses/Orbits: Unremarkable. Other: None. IMPRESSION: 1. Large hemorrhage centered in the region of the midbrain and left thalamus with surrounding edema, intraventricular extension, 1.1 cm of midline shift to the right and mild transtentorial herniation. 2. Entrapped right lateral ventricle with an interval increase in size. 3. Interval ischemic changes in the right  basal ganglia and external capsule. 4. Progressive small vessel white matter ischemic changes in both frontal lobes. Critical Value/emergent results were called by telephone at the time of interpretation on 03/29/2017 at 5:56 pm to Dr. Samuel Jester , who verbally  acknowledged these results. Electronically Signed   By: Beckie Salts M.D.   On: 03/29/2017 18:01   Dg Chest Portable 1 View Result Date: 03/29/2017 CLINICAL DATA:  Drug overdose.  Intubation. EXAM: PORTABLE CHEST 1 VIEW COMPARISON:  Chest radiograph 08/12/2011 FINDINGS: Cardiac silhouette is markedly enlarged. Endotracheal tube tip is 2 cm above the carina and could be retracted by 3 cm to place it at the level of the clavicular heads. Orogastric tube tip and side-port in the stomach. No focal airspace consolidation. Right upper thoracic opacities are likely due to mediastinal structures. The size of the upper mediastinum is likely exaggerated by venous distension because of supine positioning. IMPRESSION: 1. Endotracheal tube tip could be retracted by 3 cm 2 position it at the level of the clavicular heads. 2. Enlarged cardiomediastinal silhouette. Electronically Signed   By: Deatra Robinson M.D.   On: 03/29/2017 17:13    Results for HARU, ANSPAUGH (MRN [id number]) as of 03/29/2017 16:54  Ref. Range 11/06/2015 06:13 11/07/2015 05:44 12/06/2015 22:50 03/29/2017 16:28  BUN Latest Ref Range: 6 - 20 mg/dL 23 (H) 22 (H) 38 (H) 83 (H)  Creatinine Latest Ref Range: 0.44 - 1.00 mg/dL 7.82 (H) 9.56 (H) 2.13 (H) 11.10 (H)    Results for OVETTA, BAZZANO (MRN [id number]) as of 03/29/2017 16:54  Ref. Range 11/04/2015 05:08 11/07/2015 05:44 12/06/2015 22:50 03/29/2017 16:28  Hemoglobin Latest Ref Range: 12.0 - 15.0 g/dL 62.9 52.8 (L) 41.3 8.5 (L)  HCT Latest Ref Range: 36.0 - 46.0 % 38.8 35.7 (L) 41.1 25.0 (L)     1825: Pt intubated after arrival for airway protection. IV propofol and Cardene started for sedation and BP control with good effect. T/C to [facility] Neurosurgery Dr. Newell Coral, case discussed, including:  HPI, pertinent PM/SHx, VS/PE, dx testing, ED course and treatment:  He has viewed the CT images, states mid-brain is devastating area for bleeding, there is a potential to place a catheter to try to  decompress, but outcome will likely result in vegetative state vs full recovery, speak with family: if they want to proceed, I will need to call [facility] to transfer because he is going to OR with [facility]; if they want comfort care, then transfer to [facility] PCCM service.   1835:  I have spoken with pt's husband and daughter regarding conversation with Neurosurgeon above, with ED RN in room with me: they want to proceed "with everything to try to help her." Call placed to [facility].    1850:  T/C to [facility] Dr. Georgina Quint, case discussed, including:  HPI, pertinent PM/SHx, VS/PE, dx testing, ED course and treatment:  Agreeable to accept transfer to the ED. Family updated.   1915:  Pt's BP improved on IV cardene and propofol gtts. Pt continues tachycardic, but has hx of chronic tachycardia. No change in pt assessment:  Pt remains unresponsive, intermittently moves extremities spontaneously. No purposeful movement. Multiple family members at bedside. Awaiting transport to [facility].         Final Clinical Impressions(s) / ED Diagnoses   Final diagnoses:  None    ED Discharge Orders    None        Samuel Jester, DO 03/31/17 1455

## 2017-03-29 NOTE — ED Notes (Signed)
Labs and ABG drawn.

## 2017-03-29 NOTE — ED Notes (Signed)
Color change noted. Bilateral breath sounds. 7.0, 23 at the lip

## 2017-04-07 ENCOUNTER — Encounter: Payer: Self-pay | Admitting: Family Medicine

## 2017-04-17 DEATH — deceased

## 2019-12-16 IMAGING — CT CT HEAD W/O CM
3 series · 15 of 47 positions shown, 18 images · non-contrast
Comparison: 09/11/2014.

CLINICAL DATA: Drug overdose.  Altered mental status.  Vomiting.

EXAM:
CT HEAD WITHOUT CONTRAST
TECHNIQUE: Contiguous axial images were obtained from the base of the skull
through the vertex without intravenous contrast.

[Series 2: head trauma wo · axial · 0.47mm/px · z∈[+90,+235]mm · 9 of 35 slices shown, 12 images]
[im 3/35  brain]
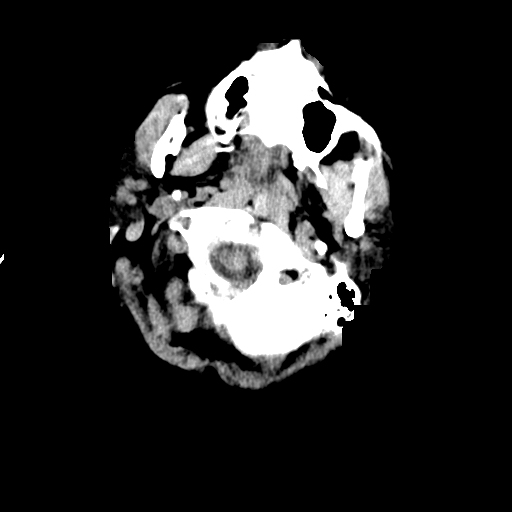
[im 3/35  bone]
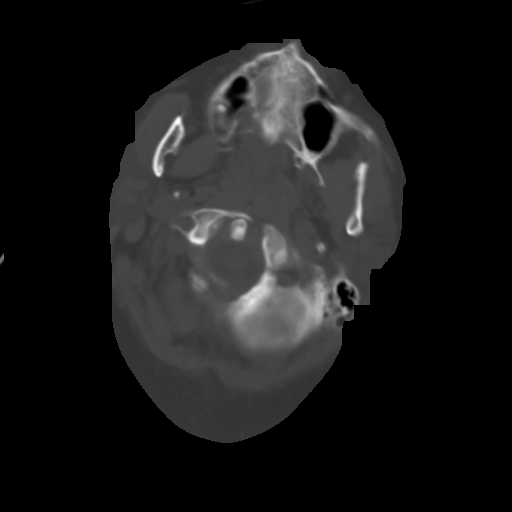
[im 6/35  brain]
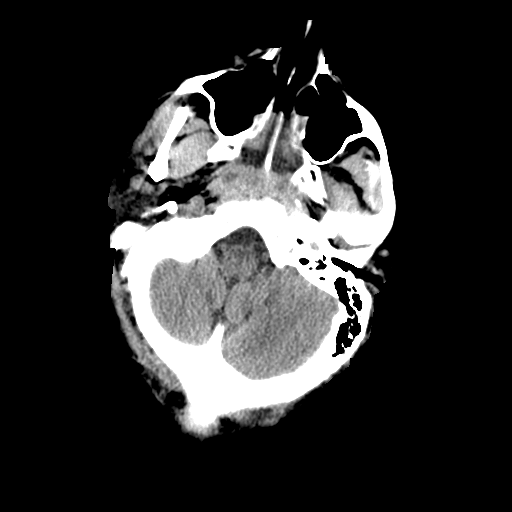
[im 10/35  brain]
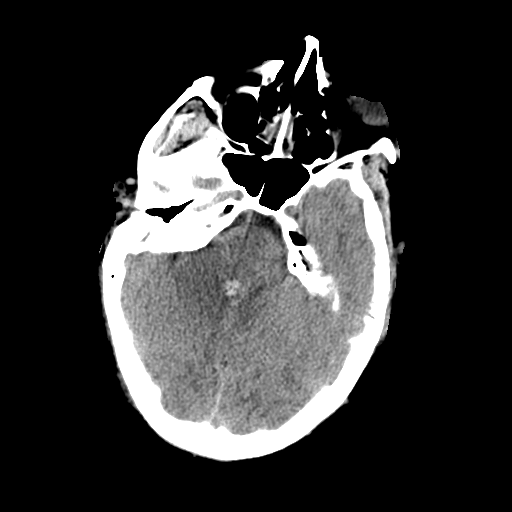
[im 13/35  brain]
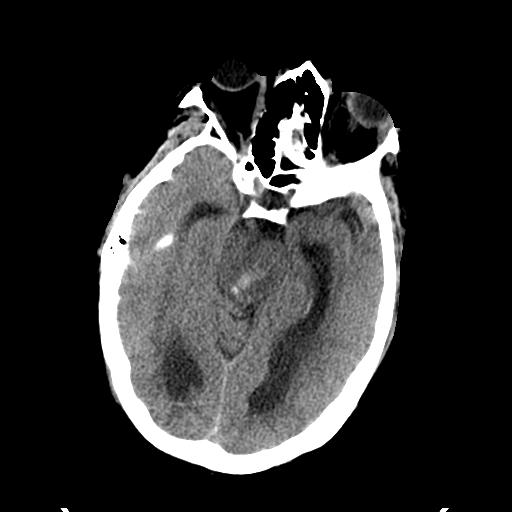
[im 18/35  brain]
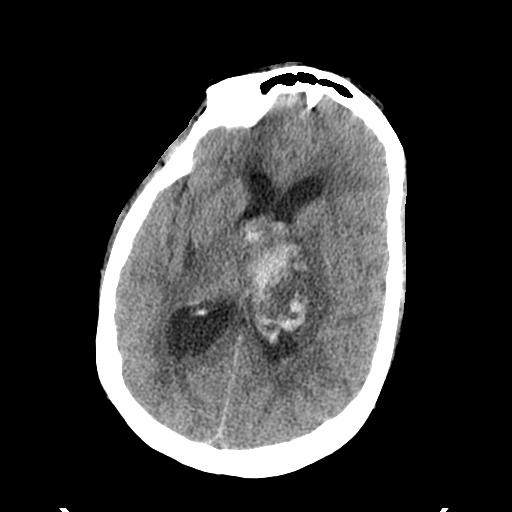
[im 18/35  bone]
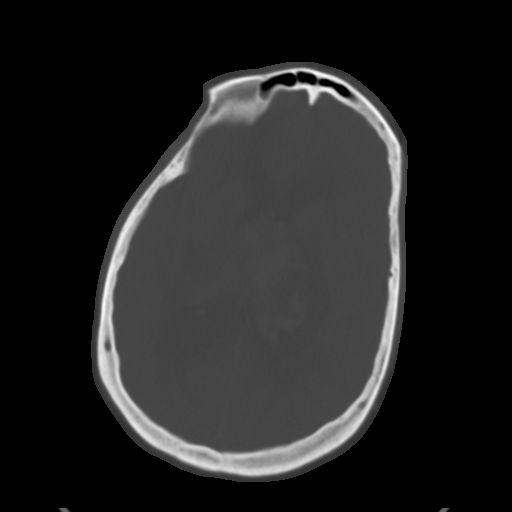
[im 22/35  brain]
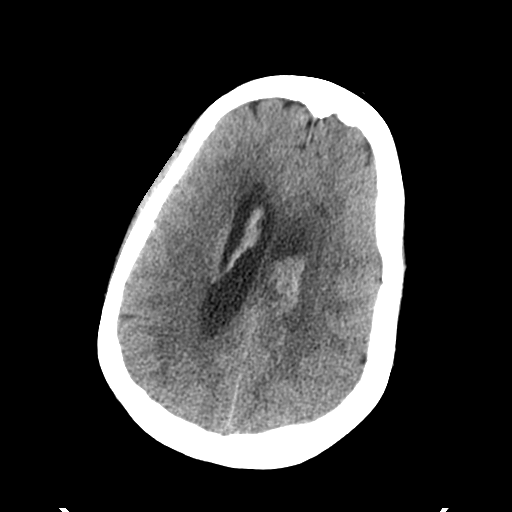
[im 25/35  brain]
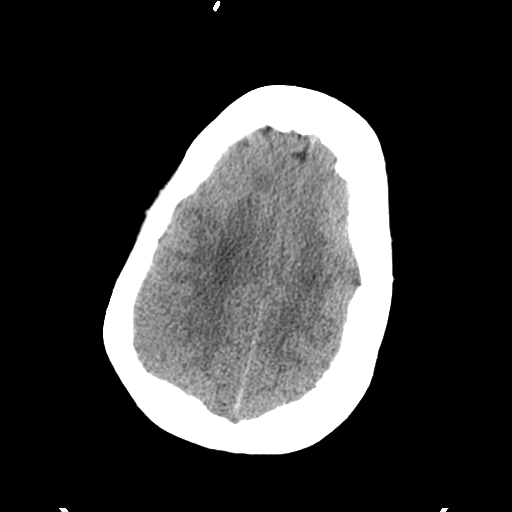
[im 29/35  brain]
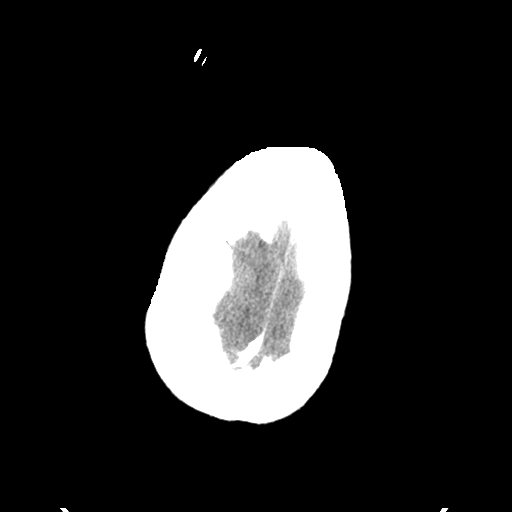
[im 32/35  brain]
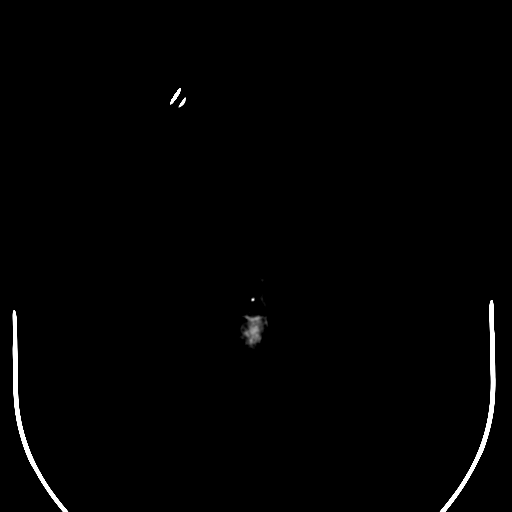
[im 32/35  bone]
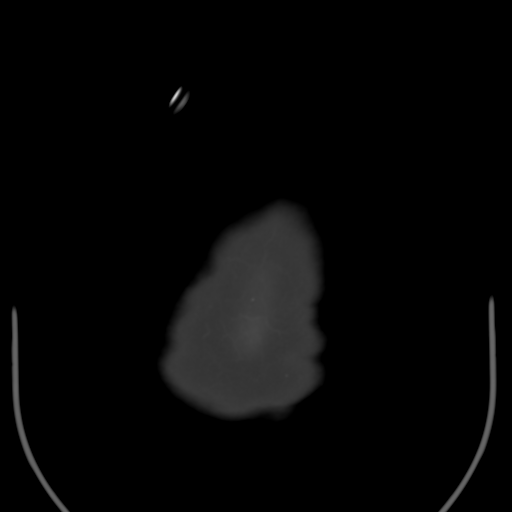

[Series 4: coronal soft tissue · coronal · 0.42mm/px · 3 of 76 slices shown]
[im 26/76  brain]
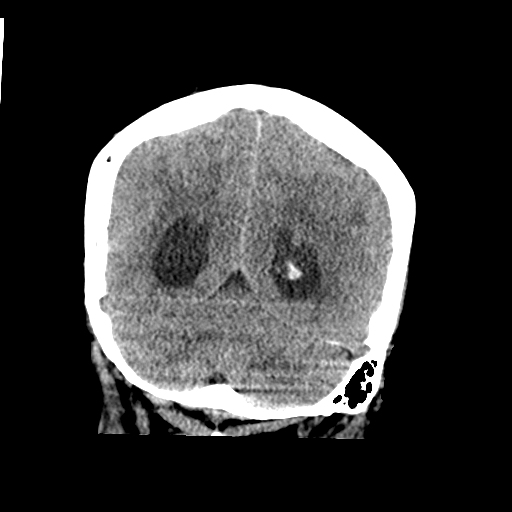
[im 34/76  brain]
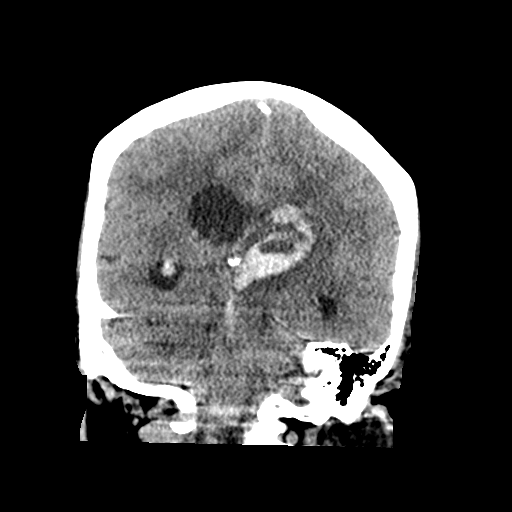
[im 42/76  brain]
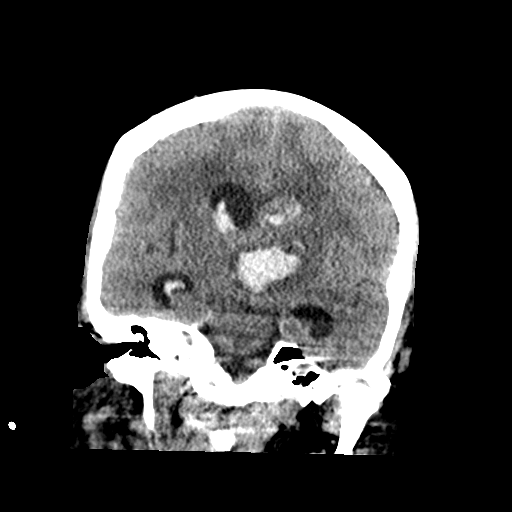

[Series 5: sagittal soft tissue · sagittal · 0.39mm/px · 3 of 60 slices shown]
[im 20/60  brain]
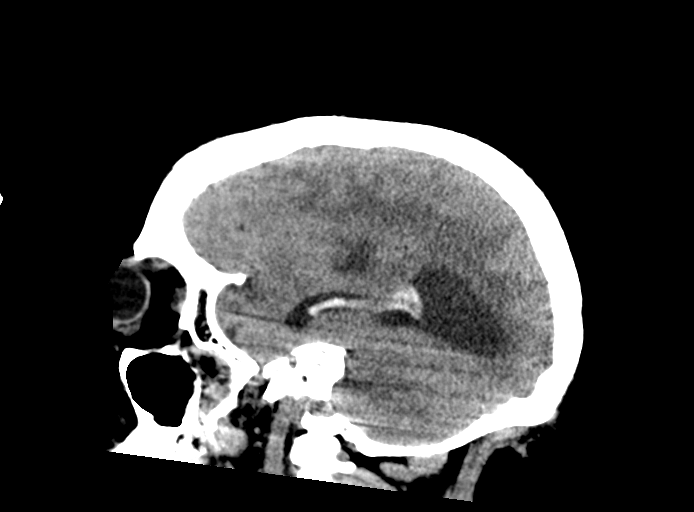
[im 30/60  brain]
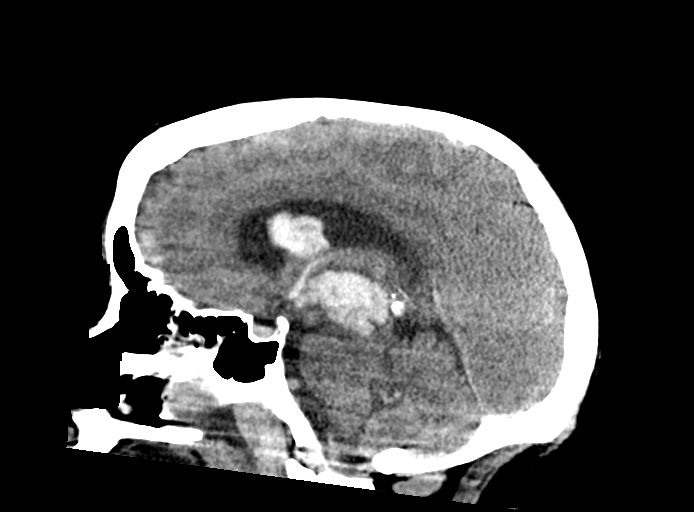
[im 40/60  brain]
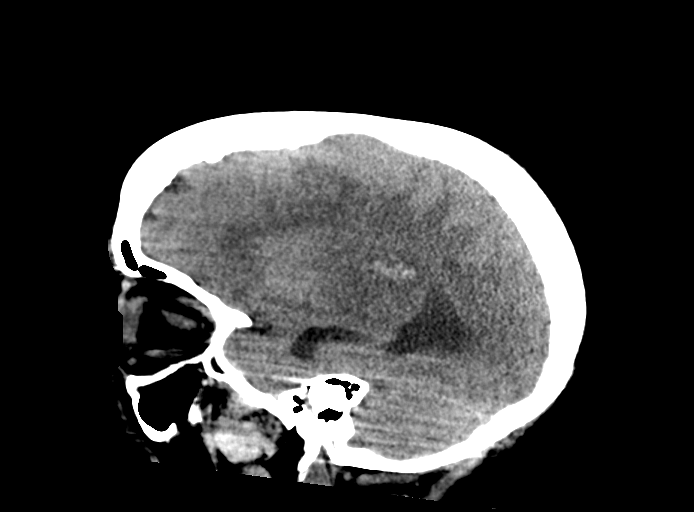

[15 of 47 positions shown; findings below may reference images not displayed]

FINDINGS: Brain: Interval large acute hemorrhage centered in the midbrain and
left thalamic region. This measures 4.1 x 3.3 cm on image number 16
of series 2. On image number 17 of that series, this is causing
cm of midline shift to the right. There is also mild flattening of
the quadrigeminal plate cistern on the right. Hemorrhage is also
demonstrated in both lateral ventricles, 3rd ventricle and 4th
ventricle.

There has been an interval increase in size of the right lateral
ventricle. Low density is demonstrated adjacent to the hemorrhage in
the brainstem and left cerebral hemisphere. Interval areas of and
external capsule. Low density in the right basal ganglia. There is
also periventricular white matter low density in both frontal lobes,
with progression.

Vascular: No hyperdense vessel or unexpected calcification.

Skull: Normal. Negative for fracture or focal lesion.

Sinuses/Orbits: Unremarkable.

Other: None.
IMPRESSION: 1. Large hemorrhage centered in the region of the midbrain and left
thalamus with surrounding edema, intraventricular extension, 1.1 cm
of midline shift to the right and mild transtentorial herniation.
2. Entrapped right lateral ventricle with an interval increase in
size.
3. Interval ischemic changes in the right basal ganglia and external
capsule.
4. Progressive small vessel white matter ischemic changes in both
frontal lobes.

Critical Value/emergent results were called by telephone at the time
of interpretation on 03/29/2017 at [DATE] to Dr. HANS-OLOV FRISTEDT ,
who verbally acknowledged these results.
